# Patient Record
Sex: Female | Born: 1985 | Race: Black or African American | Hispanic: No | Marital: Single | Smoking: Never smoker
Health system: Southern US, Community
[De-identification: ages and names within clinical notes are randomized; demographics above are authoritative.]

## PROBLEM LIST (undated history)

## (undated) ENCOUNTER — Inpatient Hospital Stay (HOSPITAL_COMMUNITY): Payer: Self-pay

## (undated) DIAGNOSIS — IMO0002 Reserved for concepts with insufficient information to code with codable children: Secondary | ICD-10-CM

## (undated) DIAGNOSIS — R51 Headache: Secondary | ICD-10-CM

## (undated) DIAGNOSIS — K589 Irritable bowel syndrome without diarrhea: Secondary | ICD-10-CM

## (undated) HISTORY — PX: NO PAST SURGERIES: SHX2092

---
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
## 2005-11-01 ENCOUNTER — Other Ambulatory Visit: Admission: RE | Admit: 2005-11-01 | Discharge: 2005-11-01 | Payer: Self-pay | Admitting: Family Medicine

## 2006-07-03 ENCOUNTER — Other Ambulatory Visit: Admission: RE | Admit: 2006-07-03 | Discharge: 2006-07-03 | Payer: Self-pay | Admitting: Obstetrics and Gynecology

## 2007-01-05 ENCOUNTER — Other Ambulatory Visit: Admission: RE | Admit: 2007-01-05 | Discharge: 2007-01-05 | Payer: Self-pay | Admitting: Obstetrics and Gynecology

## 2007-06-26 ENCOUNTER — Other Ambulatory Visit: Admission: RE | Admit: 2007-06-26 | Discharge: 2007-06-26 | Payer: Self-pay | Admitting: Obstetrics and Gynecology

## 2008-04-16 ENCOUNTER — Emergency Department (HOSPITAL_COMMUNITY): Admission: EM | Admit: 2008-04-16 | Discharge: 2008-04-16 | Payer: Self-pay | Admitting: Family Medicine

## 2008-04-26 ENCOUNTER — Emergency Department (HOSPITAL_COMMUNITY): Admission: EM | Admit: 2008-04-26 | Discharge: 2008-04-26 | Payer: Self-pay | Admitting: Emergency Medicine

## 2008-05-21 ENCOUNTER — Ambulatory Visit: Payer: Self-pay | Admitting: Gastroenterology

## 2008-05-21 DIAGNOSIS — N390 Urinary tract infection, site not specified: Secondary | ICD-10-CM

## 2008-05-21 DIAGNOSIS — K589 Irritable bowel syndrome without diarrhea: Secondary | ICD-10-CM

## 2009-01-13 ENCOUNTER — Ambulatory Visit: Payer: Self-pay | Admitting: Obstetrics and Gynecology

## 2009-01-13 ENCOUNTER — Encounter: Payer: Self-pay | Admitting: Obstetrics & Gynecology

## 2009-01-13 LAB — CONVERTED CEMR LAB
Basophils Absolute: 0 10*3/uL (ref 0.0–0.1)
Basophils Relative: 0 % (ref 0–1)
HCT: 36.9 % (ref 36.0–46.0)
Hemoglobin: 12.4 g/dL (ref 12.0–15.0)
Hepatitis B Surface Ag: NEGATIVE
Hgb A2 Quant: 3 % (ref 2.2–3.2)
Lymphs Abs: 1.8 10*3/uL (ref 0.7–4.0)
MCV: 92.5 fL (ref 78.0–100.0)
Monocytes Relative: 7 % (ref 3–12)
Neutro Abs: 4.8 10*3/uL (ref 1.7–7.7)
Neutrophils Relative %: 67 % (ref 43–77)
Rh Type: POSITIVE
Rubella: 69.9 intl units/mL — ABNORMAL HIGH

## 2009-01-21 ENCOUNTER — Encounter: Payer: Self-pay | Admitting: Family

## 2009-01-21 ENCOUNTER — Ambulatory Visit: Payer: Self-pay | Admitting: Obstetrics & Gynecology

## 2009-01-21 ENCOUNTER — Encounter: Payer: Self-pay | Admitting: Obstetrics and Gynecology

## 2009-02-18 ENCOUNTER — Ambulatory Visit: Payer: Self-pay | Admitting: Obstetrics and Gynecology

## 2009-03-18 ENCOUNTER — Ambulatory Visit (HOSPITAL_COMMUNITY): Admission: RE | Admit: 2009-03-18 | Discharge: 2009-03-18 | Payer: Self-pay | Admitting: Family Medicine

## 2009-03-18 ENCOUNTER — Ambulatory Visit: Payer: Self-pay | Admitting: Obstetrics & Gynecology

## 2009-04-15 ENCOUNTER — Ambulatory Visit: Payer: Self-pay | Admitting: Obstetrics and Gynecology

## 2009-05-13 ENCOUNTER — Ambulatory Visit: Payer: Self-pay | Admitting: Obstetrics & Gynecology

## 2009-06-03 ENCOUNTER — Ambulatory Visit: Payer: Self-pay | Admitting: Obstetrics and Gynecology

## 2009-06-03 LAB — CONVERTED CEMR LAB
HCT: 34.3 % — ABNORMAL LOW (ref 36.0–46.0)
Platelets: 300 10*3/uL (ref 150–400)
RBC: 3.74 M/uL — ABNORMAL LOW (ref 3.87–5.11)
RDW: 13.4 % (ref 11.5–15.5)

## 2009-07-08 ENCOUNTER — Ambulatory Visit: Payer: Self-pay | Admitting: Obstetrics and Gynecology

## 2009-07-15 ENCOUNTER — Encounter: Payer: Self-pay | Admitting: Family

## 2009-07-15 ENCOUNTER — Ambulatory Visit: Payer: Self-pay | Admitting: Obstetrics and Gynecology

## 2009-07-22 ENCOUNTER — Encounter: Payer: Self-pay | Admitting: Physician Assistant

## 2009-07-22 ENCOUNTER — Ambulatory Visit: Payer: Self-pay | Admitting: Obstetrics and Gynecology

## 2009-07-23 ENCOUNTER — Encounter: Payer: Self-pay | Admitting: Obstetrics and Gynecology

## 2009-07-24 ENCOUNTER — Ambulatory Visit: Payer: Self-pay | Admitting: Obstetrics & Gynecology

## 2009-07-29 ENCOUNTER — Ambulatory Visit: Payer: Self-pay | Admitting: Obstetrics and Gynecology

## 2009-07-29 ENCOUNTER — Encounter: Payer: Self-pay | Admitting: Physician Assistant

## 2009-08-05 ENCOUNTER — Ambulatory Visit: Payer: Self-pay | Admitting: Obstetrics and Gynecology

## 2009-08-12 ENCOUNTER — Ambulatory Visit: Payer: Self-pay | Admitting: Obstetrics and Gynecology

## 2009-08-12 ENCOUNTER — Encounter: Payer: Self-pay | Admitting: Advanced Practice Midwife

## 2009-08-12 LAB — CONVERTED CEMR LAB: Chlamydia, Swab/Urine, PCR: NEGATIVE

## 2009-08-13 ENCOUNTER — Ambulatory Visit: Payer: Self-pay | Admitting: Family Medicine

## 2009-08-13 ENCOUNTER — Inpatient Hospital Stay (HOSPITAL_COMMUNITY): Admission: AD | Admit: 2009-08-13 | Discharge: 2009-08-16 | Payer: Self-pay | Admitting: Family Medicine

## 2010-03-30 ENCOUNTER — Emergency Department (HOSPITAL_COMMUNITY): Admission: EM | Admit: 2010-03-30 | Discharge: 2010-03-30 | Payer: Self-pay | Admitting: Family Medicine

## 2010-06-20 NOTE — L&D Delivery Note (Signed)
Delivery Note At  a viable unspecified sex was delivered via  (Presentation: ;  ).  APGAR: , ; weight .   Placenta status: , .  Cord:  with the following complications: .  Cord pH: not done  Anesthesia:   Episiotomy:  Lacerations:  Suture Repair: 2.0 Est. Blood Loss (mL):   Mom to postpartum.  Baby to nursery-stable.  MARSHALL,BERNARD A 04/21/2011, 11:24 PM

## 2010-09-02 LAB — POCT URINALYSIS DIPSTICK
Protein, ur: NEGATIVE mg/dL
Specific Gravity, Urine: >=1.03 (ref 1.005–1.030)
Urobilinogen, UA: 0.2 mg/dL (ref 0.0–1.0)

## 2010-09-02 LAB — POCT PREGNANCY, URINE: Preg Test, Ur: NEGATIVE

## 2010-09-05 LAB — POCT URINALYSIS DIP (DEVICE)
Hgb urine dipstick: NEGATIVE
Nitrite: NEGATIVE
Protein, ur: NEGATIVE mg/dL
Specific Gravity, Urine: 1.02 (ref 1.005–1.030)
Urobilinogen, UA: 2 mg/dL — ABNORMAL HIGH (ref 0.0–1.0)
Urobilinogen, UA: 4 mg/dL — ABNORMAL HIGH (ref 0.0–1.0)
pH: 7.5 (ref 5.0–8.0)

## 2010-09-08 LAB — POCT URINALYSIS DIP (DEVICE)
Hgb urine dipstick: NEGATIVE
Ketones, ur: 40 mg/dL — AB
Ketones, ur: NEGATIVE mg/dL
Nitrite: POSITIVE — AB
Protein, ur: 30 mg/dL — AB
Protein, ur: 30 mg/dL — AB
Protein, ur: 30 mg/dL — AB
Protein, ur: NEGATIVE mg/dL
Specific Gravity, Urine: 1.025 (ref 1.005–1.030)
Specific Gravity, Urine: 1.025 (ref 1.005–1.030)
Urobilinogen, UA: 2 mg/dL — ABNORMAL HIGH (ref 0.0–1.0)
Urobilinogen, UA: 2 mg/dL — ABNORMAL HIGH (ref 0.0–1.0)
pH: 6 (ref 5.0–8.0)
pH: 6 (ref 5.0–8.0)
pH: 6.5 (ref 5.0–8.0)

## 2010-09-08 LAB — CBC
HCT: 35.2 % — ABNORMAL LOW (ref 36.0–46.0)
MCHC: 33.3 g/dL (ref 30.0–36.0)
MCV: 95.1 fL (ref 78.0–100.0)
Platelets: 338 10*3/uL (ref 150–400)
RDW: 13.7 % (ref 11.5–15.5)

## 2010-09-10 ENCOUNTER — Inpatient Hospital Stay (HOSPITAL_COMMUNITY): Payer: Medicaid Other

## 2010-09-10 ENCOUNTER — Ambulatory Visit (INDEPENDENT_AMBULATORY_CARE_PROVIDER_SITE_OTHER): Payer: Self-pay | Admitting: Obstetrics & Gynecology

## 2010-09-10 ENCOUNTER — Inpatient Hospital Stay (HOSPITAL_COMMUNITY)
Admission: AD | Admit: 2010-09-10 | Discharge: 2010-09-10 | Disposition: A | Discharge status: Home / Self Care | Payer: Medicaid Other | Source: Ambulatory Visit | Attending: Obstetrics & Gynecology | Admitting: Obstetrics & Gynecology

## 2010-09-10 ENCOUNTER — Encounter (HOSPITAL_COMMUNITY): Payer: Self-pay | Admitting: Radiology

## 2010-09-10 DIAGNOSIS — R1032 Left lower quadrant pain: Secondary | ICD-10-CM

## 2010-09-10 DIAGNOSIS — R1084 Generalized abdominal pain: Secondary | ICD-10-CM

## 2010-09-10 DIAGNOSIS — O99891 Other specified diseases and conditions complicating pregnancy: Secondary | ICD-10-CM | POA: Insufficient documentation

## 2010-09-10 DIAGNOSIS — O9989 Other specified diseases and conditions complicating pregnancy, childbirth and the puerperium: Secondary | ICD-10-CM

## 2010-09-10 DIAGNOSIS — Z3201 Encounter for pregnancy test, result positive: Secondary | ICD-10-CM

## 2010-09-10 LAB — URINALYSIS, ROUTINE W REFLEX MICROSCOPIC
Bilirubin Urine: NEGATIVE
Glucose, UA: NEGATIVE mg/dL
Nitrite: NEGATIVE
Specific Gravity, Urine: >1.03 — ABNORMAL HIGH (ref 1.005–1.030)
pH: 5.5 (ref 5.0–8.0)

## 2010-09-10 LAB — CBC
MCH: 30.5 pg (ref 26.0–34.0)
MCHC: 32.8 g/dL (ref 30.0–36.0)
Platelets: 370 10*3/uL (ref 150–400)
RDW: 12.9 % (ref 11.5–15.5)

## 2010-09-10 LAB — URINE MICROSCOPIC-ADD ON

## 2010-09-10 LAB — HCG, QUANTITATIVE, PREGNANCY: hCG, Beta Chain, Quant, S: 254 m[IU]/mL — ABNORMAL HIGH (ref <5)

## 2010-09-10 LAB — POCT PREGNANCY, URINE: Preg Test, Ur: POSITIVE

## 2010-09-11 NOTE — Progress Notes (Signed)
NAME:  Judith Mcintosh, Judith Mcintosh NO.:  [id number]  MEDICAL RECORD NO.:  [id number]           PATIENT TYPE:  A  LOCATION:  [facility]                   FACILITY:  [facility]  PHYSICIAN:  Allie Bossier, MD        DATE OF BIRTH:  Apr 22, 1986  DATE OF SERVICE:  09/10/2010                                 CLINIC NOTE  [name] is a 25 year old single black, gravida 3, para 2 with an unknown LMP (she thinks it was the beginning of February).  She started experiencing left lower quadrant pain today and called the GYN Clinic and they were able to work her in today.  Upon arrival, it turns out that she has a positive pregnancy test.  A quick abdominal exam shows that she does not have any peritoneal signs.  Due to the fact that the clinic will be closed in an hour, I have recommended that we send her immediately to the MAU where she can have the appropriate workup for rule out ectopic pregnancy.  She is amenable to this.  I called the MAU and they are not busy at this time.  Please note her vital signs are stable at this moment.     Allie Bossier, MD    MCD/MEDQ  D:  09/10/2010  T:  09/11/2010  Job:  [id number]

## 2010-09-13 ENCOUNTER — Other Ambulatory Visit: Payer: Self-pay | Admitting: Obstetrics & Gynecology

## 2010-09-13 ENCOUNTER — Inpatient Hospital Stay (HOSPITAL_COMMUNITY)
Admission: AD | Admit: 2010-09-13 | Discharge: 2010-09-13 | Disposition: A | Discharge status: Home / Self Care | Payer: Medicaid Other | Source: Ambulatory Visit | Attending: Obstetrics & Gynecology | Admitting: Obstetrics & Gynecology

## 2010-09-13 DIAGNOSIS — O99891 Other specified diseases and conditions complicating pregnancy: Secondary | ICD-10-CM | POA: Insufficient documentation

## 2010-09-13 DIAGNOSIS — O3680X0 Pregnancy with inconclusive fetal viability, not applicable or unspecified: Secondary | ICD-10-CM

## 2010-09-13 DIAGNOSIS — O9989 Other specified diseases and conditions complicating pregnancy, childbirth and the puerperium: Secondary | ICD-10-CM

## 2010-09-20 ENCOUNTER — Inpatient Hospital Stay (HOSPITAL_COMMUNITY)
Admission: AD | Admit: 2010-09-20 | Discharge: 2010-09-20 | Disposition: A | Discharge status: Home / Self Care | Payer: Medicaid Other | Source: Ambulatory Visit | Attending: Obstetrics & Gynecology | Admitting: Obstetrics & Gynecology

## 2010-09-20 ENCOUNTER — Ambulatory Visit (HOSPITAL_COMMUNITY)
Admission: RE | Admit: 2010-09-20 | Discharge: 2010-09-20 | Disposition: A | Discharge status: Home / Self Care | Payer: Medicaid Other | Source: Ambulatory Visit | Attending: Obstetrics & Gynecology | Admitting: Obstetrics & Gynecology

## 2010-09-20 DIAGNOSIS — O3680X0 Pregnancy with inconclusive fetal viability, not applicable or unspecified: Secondary | ICD-10-CM

## 2010-09-20 DIAGNOSIS — O368390 Maternal care for abnormalities of the fetal heart rate or rhythm, unspecified trimester, not applicable or unspecified: Secondary | ICD-10-CM | POA: Insufficient documentation

## 2010-09-20 DIAGNOSIS — O99891 Other specified diseases and conditions complicating pregnancy: Secondary | ICD-10-CM | POA: Insufficient documentation

## 2010-09-20 DIAGNOSIS — Z3689 Encounter for other specified antenatal screening: Secondary | ICD-10-CM | POA: Insufficient documentation

## 2010-09-21 LAB — POCT URINALYSIS DIP (DEVICE)
Ketones, ur: NEGATIVE mg/dL
Protein, ur: NEGATIVE mg/dL
Specific Gravity, Urine: 1.025 (ref 1.005–1.030)
pH: 5.5 (ref 5.0–8.0)

## 2010-09-22 LAB — POCT URINALYSIS DIP (DEVICE)
Protein, ur: NEGATIVE mg/dL
Specific Gravity, Urine: 1.02 (ref 1.005–1.030)
Urobilinogen, UA: 1 mg/dL (ref 0.0–1.0)

## 2010-09-23 LAB — POCT URINALYSIS DIP (DEVICE)
Hgb urine dipstick: NEGATIVE
Nitrite: NEGATIVE
Protein, ur: NEGATIVE mg/dL
Urobilinogen, UA: 1 mg/dL (ref 0.0–1.0)
pH: 6 (ref 5.0–8.0)

## 2010-09-24 LAB — POCT URINALYSIS DIP (DEVICE)
Nitrite: NEGATIVE
Protein, ur: NEGATIVE mg/dL
Protein, ur: 30 mg/dL — AB
Specific Gravity, Urine: 1.025 (ref 1.005–1.030)
Urobilinogen, UA: 1 mg/dL (ref 0.0–1.0)
Urobilinogen, UA: 1 mg/dL (ref 0.0–1.0)
pH: 7.5 (ref 5.0–8.0)

## 2010-09-25 LAB — POCT URINALYSIS DIP (DEVICE)
Glucose, UA: NEGATIVE mg/dL
Hgb urine dipstick: NEGATIVE
Nitrite: NEGATIVE
Urobilinogen, UA: 1 mg/dL (ref 0.0–1.0)

## 2010-11-05 NOTE — Op Note (Signed)
NAME:  Judith Mcintosh, Judith Mcintosh                       ACCOUNT NO.:  [id number]   MEDICAL RECORD NO.:  [id number]                   PATIENT TYPE:  INP   LOCATION:  9163                                 [facility]   PHYSICIAN:  Charles A. Delcambre, MD            DATE OF BIRTH:  [birth year]   DATE OF PROCEDURE:  07/29/2003  DATE OF DISCHARGE:                                 OPERATIVE REPORT   This patient was admitted after coming in in spontaneous labor.  She is a 25-  year-old black female, para 0-0-0-0, [facility] July 24, 2003, by 17-week  ultrasound.  Estimated gestational age [redacted] weeks 5 days.  She presented  complaining of contractions onset about 10 p.m. last night and every three  minutes and moderate.  She denied rupture of membranes.  She did have some  bleeding that was spotting with contractions, and she noted active fetal  movement.  Upon arrival she was 8-9 cm, at 0200-0300.  She is Rh positive,  rubella immune, hemoglobin 12.6, platelets 322. One-hour Glucola 98.  Quad  screen normal.  Positive group B strep.  She received penicillin prophylaxis  during this labor course.   PHYSICAL EXAMINATION:  GENERAL:  Alert and oriented x3.  VITAL SIGNS:  Blood pressure 100s/70s, respirations 16, pulse 80s, afebrile.  HEENT:  There is question of sties at the eyes bilaterally.  She has seen  her PCP in this regard.  These are stable.  NECK:  Supple.  CARDIAC:  Regular rate and rhythm.  CHEST:  Lungs clear.  ABDOMEN:  Gravid, fundal height 40 cm, estimated fetal weight 3600 g.  Fetal  heart tones 130s, reactive, without decelerations.  Contractions were every  three to four minutes.  EXTREMITIES:  Nontender.  PELVIC:  Cervix was 8, complete, 0 station.  A bulging bag of water was  noted.   With this arrest at 8 cm by my examination at 0640, inadequate labor was  felt to be present with intact membranes.  Artificial rupture of membranes  was therefore done to augment, no complications  were noted.  Moderate  meconium was noted.  Pitocin was begun at low dose and increased to 4 milli-  international units per minute to augment.  At 0825 she was 8, complete, and  0 station, no change.  Fetal heart rate 130-140s, reactive without  decelerations.  Contractions were every three minutes, Pitocin was at 4.  She was observed a further hour and was noted to be completely dilated,  complete, and 0 to +1 station.  Fetal heart rate was 130s, reactive without  decelerations.  Contractions were every three minutes.  Pitocin was at 4  milli-international units per minute.  She began pushing at that time.  She  proceeded on for timing purposes, complete dilation at 0928, and delivery at  1048.  Placenta followed at 1053.  Indicated vacuum extraction was done  secondary to fetal heart rate  decelerations either late decelerations or  moderate variables from the 150s-160s down to 120s after contractions.  These did not improve with position change and for this reason, vacuum  extraction was undertaken with lack of progress beyond +2 station with the  patient's pushing efforts.  Manual rotation was achieved from right occiput  posterior to right occiput anterior.  The Kiwi vacuum was then applied, the  Foley was removed.  In the green zone the Kiwi vacuum was used and there was  good progress over three pushes to achieve change from +2 to +3 station.  At  that time one pop-off occurred.  She pushed further for two pushes without  the vacuum and had little change.  For that reason vacuum was reapplied at  +3 station and in one contraction, again in the green zone, with maternal  pushing efforts the baby delivered.  Apgars were 4 and 9 at one and five  minutes, respectively.  Cord gases returned arterial 7.14, venous 7.25.  The  baby was vigorous after stimulation.  Total vacuum time was 11 minutes.  Total contractions the vacuum was used was four, one pop-off occurred.  A  second degree  midline episiotomy was done during the vacuum extraction.  A  right lateral vaginal laceration was noted and repaired with 3-0 Vicryl.  Chromic 4-0 was used to repair a right labial laceration.  The placenta was  three-vessel, intact, and spontaneous.  Estimated blood loss 400 mL.  Once  delivery of head and shoulders, meconium was suctioned with the DeLee  suction.  The baby cried spontaneously.   COMPLICATIONS:  1. Variable decelerations, some late decelerations.  2. Meconium.   PROCEDURES:  1. Indicated vacuum extraction.  2. Repair of episiotomy and lacerations.                                               Charles A. Sydnee Cabal, MD    CAD/MEDQ  D:  07/29/2003  T:  07/29/2003  Job:  [id number]

## 2010-12-01 ENCOUNTER — Emergency Department (HOSPITAL_COMMUNITY)
Admission: EM | Admit: 2010-12-01 | Discharge: 2010-12-01 | Disposition: A | Discharge status: Home / Self Care | Payer: Medicaid Other | Attending: Emergency Medicine | Admitting: Emergency Medicine

## 2010-12-01 DIAGNOSIS — R509 Fever, unspecified: Secondary | ICD-10-CM | POA: Insufficient documentation

## 2010-12-01 DIAGNOSIS — Z8541 Personal history of malignant neoplasm of cervix uteri: Secondary | ICD-10-CM | POA: Insufficient documentation

## 2010-12-01 DIAGNOSIS — O9989 Other specified diseases and conditions complicating pregnancy, childbirth and the puerperium: Secondary | ICD-10-CM | POA: Insufficient documentation

## 2010-12-01 DIAGNOSIS — J069 Acute upper respiratory infection, unspecified: Secondary | ICD-10-CM | POA: Insufficient documentation

## 2010-12-01 DIAGNOSIS — R51 Headache: Secondary | ICD-10-CM | POA: Insufficient documentation

## 2010-12-01 DIAGNOSIS — IMO0001 Reserved for inherently not codable concepts without codable children: Secondary | ICD-10-CM | POA: Insufficient documentation

## 2010-12-01 DIAGNOSIS — R000 Tachycardia, unspecified: Secondary | ICD-10-CM | POA: Insufficient documentation

## 2010-12-01 LAB — URINALYSIS, ROUTINE W REFLEX MICROSCOPIC
Bilirubin Urine: NEGATIVE
Bilirubin Urine: NEGATIVE
Glucose, UA: NEGATIVE mg/dL
Ketones, ur: 40 mg/dL — AB
Nitrite: NEGATIVE
Protein, ur: NEGATIVE mg/dL
Specific Gravity, Urine: 1 — ABNORMAL LOW (ref 1.005–1.030)
Urobilinogen, UA: 1 mg/dL (ref 0.0–1.0)
pH: 7.5 (ref 5.0–8.0)

## 2010-12-01 LAB — CBC
Hemoglobin: 10.9 g/dL — ABNORMAL LOW (ref 12.0–15.0)
MCHC: 34.9 g/dL (ref 30.0–36.0)
Platelets: 277 10*3/uL (ref 150–400)
RDW: 13.5 % (ref 11.5–15.5)

## 2010-12-01 LAB — COMPREHENSIVE METABOLIC PANEL
ALT: 7 U/L (ref 0–35)
AST: 11 U/L (ref 0–37)
Albumin: 3.3 g/dL — ABNORMAL LOW (ref 3.5–5.2)
Alkaline Phosphatase: 58 U/L (ref 39–117)
Glucose, Bld: 80 mg/dL (ref 70–99)
Potassium: 3.6 mEq/L (ref 3.5–5.1)
Sodium: 135 mEq/L (ref 135–145)
Total Protein: 7 g/dL (ref 6.0–8.3)

## 2010-12-01 LAB — URINE MICROSCOPIC-ADD ON

## 2010-12-01 LAB — DIFFERENTIAL
Basophils Absolute: 0 10*3/uL (ref 0.0–0.1)
Basophils Relative: 0 % (ref 0–1)
Monocytes Absolute: 0.7 10*3/uL (ref 0.1–1.0)
Neutro Abs: 7.5 10*3/uL (ref 1.7–7.7)

## 2010-12-02 ENCOUNTER — Inpatient Hospital Stay (HOSPITAL_COMMUNITY)
Admission: AD | Admit: 2010-12-02 | Discharge: 2010-12-02 | Disposition: A | Discharge status: Home / Self Care | Payer: Medicaid Other | Source: Ambulatory Visit | Attending: Obstetrics | Admitting: Obstetrics

## 2010-12-02 DIAGNOSIS — R51 Headache: Secondary | ICD-10-CM | POA: Insufficient documentation

## 2010-12-02 DIAGNOSIS — E860 Dehydration: Secondary | ICD-10-CM | POA: Insufficient documentation

## 2010-12-02 DIAGNOSIS — O99891 Other specified diseases and conditions complicating pregnancy: Secondary | ICD-10-CM | POA: Insufficient documentation

## 2010-12-02 LAB — URINE CULTURE: Colony Count: 60000

## 2010-12-02 LAB — URINALYSIS, ROUTINE W REFLEX MICROSCOPIC
Hgb urine dipstick: NEGATIVE
Ketones, ur: >80 mg/dL — AB
Protein, ur: NEGATIVE mg/dL
Urobilinogen, UA: 2 mg/dL — ABNORMAL HIGH (ref 0.0–1.0)

## 2011-03-22 LAB — POCT URINALYSIS DIP (DEVICE)
Glucose, UA: NEGATIVE
Nitrite: NEGATIVE
Operator id: 247071
Protein, ur: NEGATIVE
Specific Gravity, Urine: 1.025
Urobilinogen, UA: 0.2

## 2011-03-22 LAB — URINE MICROSCOPIC-ADD ON

## 2011-03-22 LAB — URINALYSIS, ROUTINE W REFLEX MICROSCOPIC
Glucose, UA: NEGATIVE
Hgb urine dipstick: NEGATIVE
Specific Gravity, Urine: 1.034 — ABNORMAL HIGH
Urobilinogen, UA: 1
pH: 6.5

## 2011-03-22 LAB — COMPREHENSIVE METABOLIC PANEL
ALT: 14
AST: 19
Alkaline Phosphatase: 50
CO2: 26
Chloride: 104
GFR calc Af Amer: >60
GFR calc non Af Amer: >60
Glucose, Bld: 112 — ABNORMAL HIGH
Potassium: 3.7
Sodium: 136
Total Bilirubin: 0.8

## 2011-03-22 LAB — WET PREP, GENITAL

## 2011-03-22 LAB — GC/CHLAMYDIA PROBE AMP, GENITAL
Chlamydia, DNA Probe: NEGATIVE
GC Probe Amp, Genital: NEGATIVE

## 2011-03-22 LAB — DIFFERENTIAL
Basophils Relative: 1
Eosinophils Absolute: 0
Eosinophils Relative: 1
Lymphs Abs: 2.4

## 2011-03-22 LAB — CBC
Hemoglobin: 13.1
RBC: 4.01
WBC: 8.8

## 2011-03-22 LAB — LIPASE, BLOOD: Lipase: 591 — ABNORMAL HIGH

## 2011-03-22 LAB — POCT PREGNANCY, URINE: Preg Test, Ur: NEGATIVE

## 2011-04-14 LAB — STREP B DNA PROBE: GBS: NEGATIVE

## 2011-04-21 ENCOUNTER — Inpatient Hospital Stay (HOSPITAL_COMMUNITY)
Admission: AD | Admit: 2011-04-21 | Discharge: 2011-04-23 | DRG: 775 | Disposition: A | Discharge status: Home / Self Care | Payer: Medicaid Other | Source: Ambulatory Visit | Attending: Obstetrics & Gynecology | Admitting: Obstetrics & Gynecology

## 2011-04-21 ENCOUNTER — Encounter (HOSPITAL_COMMUNITY): Payer: Self-pay

## 2011-04-21 ENCOUNTER — Encounter (HOSPITAL_COMMUNITY): Payer: Self-pay | Admitting: Anesthesiology

## 2011-04-21 ENCOUNTER — Inpatient Hospital Stay (HOSPITAL_COMMUNITY): Payer: Medicaid Other | Admitting: Anesthesiology

## 2011-04-21 DIAGNOSIS — O42919 Preterm premature rupture of membranes, unspecified as to length of time between rupture and onset of labor, unspecified trimester: Secondary | ICD-10-CM

## 2011-04-21 DIAGNOSIS — O4290 Premature rupture of membranes, unspecified as to length of time between rupture and onset of labor, unspecified weeks of gestation: Principal | ICD-10-CM | POA: Diagnosis present

## 2011-04-21 DIAGNOSIS — D649 Anemia, unspecified: Secondary | ICD-10-CM | POA: Diagnosis not present

## 2011-04-21 DIAGNOSIS — O9903 Anemia complicating the puerperium: Secondary | ICD-10-CM | POA: Diagnosis not present

## 2011-04-21 HISTORY — DX: Headache: R51

## 2011-04-21 HISTORY — DX: Reserved for concepts with insufficient information to code with codable children: IMO0002

## 2011-04-21 LAB — CBC
Hemoglobin: 10 g/dL — ABNORMAL LOW (ref 12.0–15.0)
MCH: 28.7 pg (ref 26.0–34.0)
MCHC: 32.4 g/dL (ref 30.0–36.0)
RDW: 14 % (ref 11.5–15.5)

## 2011-04-21 LAB — RPR: RPR Ser Ql: NONREACTIVE

## 2011-04-21 MED ORDER — CITRIC ACID-SODIUM CITRATE 334-500 MG/5ML PO SOLN: 30.0000 mL | ORAL | Status: DC | PRN

## 2011-04-21 MED ORDER — OXYTOCIN 20 UNITS IN LACTATED RINGERS INFUSION - SIMPLE
1.0000 m[IU]/min | INTRAVENOUS | Status: DC
  Administered 2011-04-21: 10 m[IU]/min via INTRAVENOUS
  Administered 2011-04-21: 2 m[IU]/min via INTRAVENOUS
  Administered 2011-04-21: 333 m[IU]/min via INTRAVENOUS
  Administered 2011-04-21: 12 m[IU]/min via INTRAVENOUS
  Filled 2011-04-21: qty 1000

## 2011-04-21 MED ORDER — EPHEDRINE 5 MG/ML INJ
10.0000 mg | INTRAVENOUS | Status: DC | PRN
  Filled 2011-04-21 (×2): qty 4

## 2011-04-21 MED ORDER — LACTATED RINGERS IV SOLN
500.0000 mL | INTRAVENOUS | Status: DC | PRN
  Administered 2011-04-21: 500 mL via INTRAVENOUS

## 2011-04-21 MED ORDER — ACETAMINOPHEN 325 MG PO TABS: 650.0000 mg | ORAL_TABLET | ORAL | Status: DC | PRN

## 2011-04-21 MED ORDER — EPHEDRINE 5 MG/ML INJ
10.0000 mg | INTRAVENOUS | Status: DC | PRN
  Filled 2011-04-21: qty 4

## 2011-04-21 MED ORDER — ONDANSETRON HCL 4 MG/2ML IJ SOLN: 4.0000 mg | Freq: Four times a day (QID) | INTRAMUSCULAR | Status: DC | PRN

## 2011-04-21 MED ORDER — DIPHENHYDRAMINE HCL 50 MG/ML IJ SOLN: 12.5000 mg | INTRAMUSCULAR | Status: DC | PRN

## 2011-04-21 MED ORDER — OXYTOCIN BOLUS FROM INFUSION
500.0000 mL | Freq: Once | INTRAVENOUS | Status: DC
  Filled 2011-04-21: qty 500

## 2011-04-21 MED ORDER — LIDOCAINE HCL 1.5 % IJ SOLN
INTRAMUSCULAR | Status: DC | PRN
  Administered 2011-04-21: 4 mL via EPIDURAL
  Administered 2011-04-21: 3 mL via EPIDURAL

## 2011-04-21 MED ORDER — TERBUTALINE SULFATE 1 MG/ML IJ SOLN: 0.2500 mg | Freq: Once | INTRAMUSCULAR | Status: AC | PRN

## 2011-04-21 MED ORDER — BUTORPHANOL TARTRATE 2 MG/ML IJ SOLN: 1.0000 mg | INTRAMUSCULAR | Status: DC | PRN

## 2011-04-21 MED ORDER — OXYTOCIN 20 UNITS IN LACTATED RINGERS INFUSION - SIMPLE: 125.0000 mL/h | Freq: Once | INTRAVENOUS | Status: DC

## 2011-04-21 MED ORDER — FENTANYL 2.5 MCG/ML BUPIVACAINE 1/10 % EPIDURAL INFUSION (WH - ANES)
14.0000 mL/h | INTRAMUSCULAR | Status: DC
  Filled 2011-04-21 (×2): qty 60

## 2011-04-21 MED ORDER — PHENYLEPHRINE 40 MCG/ML (10ML) SYRINGE FOR IV PUSH (FOR BLOOD PRESSURE SUPPORT)
80.0000 ug | PREFILLED_SYRINGE | INTRAVENOUS | Status: DC | PRN
  Filled 2011-04-21 (×2): qty 5

## 2011-04-21 MED ORDER — IBUPROFEN 600 MG PO TABS
600.0000 mg | ORAL_TABLET | Freq: Four times a day (QID) | ORAL | Status: DC | PRN
  Administered 2011-04-22: 600 mg via ORAL
  Filled 2011-04-21: qty 1

## 2011-04-21 MED ORDER — LIDOCAINE HCL (PF) 1 % IJ SOLN
30.0000 mL | INTRAMUSCULAR | Status: DC | PRN
  Filled 2011-04-21 (×2): qty 30

## 2011-04-21 MED ORDER — HYDROXYZINE HCL 50 MG PO TABS
50.0000 mg | ORAL_TABLET | Freq: Four times a day (QID) | ORAL | Status: DC | PRN
  Filled 2011-04-21: qty 1

## 2011-04-21 MED ORDER — LACTATED RINGERS IV SOLN: 500.0000 mL | Freq: Once | INTRAVENOUS | Status: DC

## 2011-04-21 MED ORDER — PHENYLEPHRINE 40 MCG/ML (10ML) SYRINGE FOR IV PUSH (FOR BLOOD PRESSURE SUPPORT)
80.0000 ug | PREFILLED_SYRINGE | INTRAVENOUS | Status: DC | PRN
  Filled 2011-04-21: qty 5

## 2011-04-21 MED ORDER — FENTANYL 2.5 MCG/ML BUPIVACAINE 1/10 % EPIDURAL INFUSION (WH - ANES)
INTRAMUSCULAR | Status: DC | PRN
  Administered 2011-04-21: 11 mL/h via EPIDURAL
  Administered 2011-04-21: 20:00:00

## 2011-04-21 MED ORDER — ZOLPIDEM TARTRATE 10 MG PO TABS: 10.0000 mg | ORAL_TABLET | Freq: Every evening | ORAL | Status: DC | PRN

## 2011-04-21 MED ORDER — OXYCODONE-ACETAMINOPHEN 5-325 MG PO TABS
2.0000 | ORAL_TABLET | ORAL | Status: DC | PRN
  Filled 2011-04-21 (×4): qty 1

## 2011-04-21 MED ORDER — LACTATED RINGERS IV SOLN
INTRAVENOUS | Status: DC
  Administered 2011-04-21 (×3): via INTRAVENOUS

## 2011-04-21 MED ORDER — HYDROXYZINE HCL 50 MG/ML IM SOLN
50.0000 mg | Freq: Four times a day (QID) | INTRAMUSCULAR | Status: DC | PRN
  Filled 2011-04-21: qty 1

## 2011-04-21 NOTE — H&P (Signed)
Judith Mcintosh is a 25 y.o. female presenting for SROM. Maternal Medical History:  Reason for admission: Reason for admission: rupture of membranes.  Reason for Admission:   nauseaFetal activity: Perceived fetal activity is normal.    Prenatal complications: no prenatal complications   OB History    Grav Para Term Preterm Abortions TAB SAB Ect Mult Living   3 2 2  0  0 0 0 0 2     Past Medical History  Diagnosis Date  . Abnormal Pap smear 2005    LEEP   Past Surgical History  Procedure Date  . Leep 2005   Family History: family history is not on file. Social History:  reports that she has never smoked. She does not have any smokeless tobacco history on file. She reports that she does not drink alcohol or use illicit drugs.  Review of Systems  Constitutional: Negative for fever.  Eyes: Negative for blurred vision.  Respiratory: Negative for shortness of breath.   Gastrointestinal: Negative for nausea and vomiting.  Skin: Negative for rash.  Neurological: Negative for headaches.    Exam by:: Henrietta Hoover, PA Blood pressure 113/47, pulse 101, temperature 99.5 F (37.5 C), temperature source Oral, resp. rate 20, height 4\' 8"  (1.422 m), weight 68.04 kg (150 lb), SpO2 99.00%. Maternal Exam:  Abdomen: Patient reports no abdominal tenderness. Fetal presentation: vertex  Introitus: not evaluated.     Fetal Exam Fetal Monitor Review: Variability: moderate (6-25 bpm).   Pattern: accelerations present and no decelerations.    Fetal State Assessment: Category I - tracings are normal.     Physical Exam  Constitutional: She appears well-developed.  HENT:  Head: Normocephalic.  Neck: Neck supple. No thyromegaly present.  Cardiovascular: Normal rate and regular rhythm.   Respiratory: Breath sounds normal.  GI: Soft. Bowel sounds are normal.  Skin: No rash noted.    Prenatal labs: ABO, Rh:   Antibody:   Rubella:   RPR: Nonreactive (05/29 0000)  HBsAg:    HIV:  Non-reactive (05/29 0000)  GBS:     Assessment/Plan: Multipara with PPROM @ [redacted]w[redacted]d, not in labor.  GBS negative on 10/25.  Admit Low dose Pitocin per protocol   JACKSON-MOORE,Jori Frerichs A 04/21/2011, 1:12 PM

## 2011-04-21 NOTE — Progress Notes (Signed)
Patient states she started leaking clear fluid at 1130. No contractions.

## 2011-04-21 NOTE — Anesthesia Preprocedure Evaluation (Signed)
Anesthesia Evaluation  Patient identified by MRN, date of birth, ID band Patient awake    Reviewed: Allergy & Precautions, H&P , Patient's Chart, lab work & pertinent test results  Airway Mallampati: III TM Distance: >3 FB Neck ROM: full    Dental No notable dental hx. (+) Teeth Intact   Pulmonary neg pulmonary ROS,  clear to auscultation  Pulmonary exam normal       Cardiovascular neg cardio ROS regular Normal    Neuro/Psych  Headaches, Negative Neurological ROS  Negative Psych ROS   GI/Hepatic negative GI ROS, Neg liver ROS,   Endo/Other  Negative Endocrine ROS  Renal/GU negative Renal ROS  Genitourinary negative   Musculoskeletal   Abdominal   Peds  Hematology negative hematology ROS (+)   Anesthesia Other Findings   Reproductive/Obstetrics (+) Pregnancy                           Anesthesia Physical Anesthesia Plan  ASA: II  Anesthesia Plan: Epidural   Post-op Pain Management:    Induction:   Airway Management Planned:   Additional Equipment:   Intra-op Plan:   Post-operative Plan:   Informed Consent: I have reviewed the patients History and Physical, chart, labs and discussed the procedure including the risks, benefits and alternatives for the proposed anesthesia with the patient or authorized representative who has indicated his/her understanding and acceptance.     Plan Discussed with: Anesthesiologist  Anesthesia Plan Comments:         Anesthesia Quick Evaluation

## 2011-04-21 NOTE — Anesthesia Procedure Notes (Signed)
Epidural Patient location during procedure: OB Start time: 04/21/2011 5:27 PM  Staffing Anesthesiologist: Synia Douglass A. Performed by: anesthesiologist   Preanesthetic Checklist Completed: patient identified, site marked, surgical consent, pre-op evaluation, timeout performed, IV checked, risks and benefits discussed and monitors and equipment checked  Epidural Patient position: sitting Prep: site prepped and draped and DuraPrep Patient monitoring: continuous pulse ox and blood pressure Approach: midline Injection technique: LOR air  Needle:  Needle type: Tuohy  Needle gauge: 17 G Needle length: 9 cm Needle insertion depth: 4 cm Catheter type: closed end flexible Catheter size: 19 Gauge Catheter at skin depth: 9 cm Test dose: negative and 1.5% lidocaine  Assessment Events: blood not aspirated, injection not painful, no injection resistance, negative IV test and no paresthesia  Additional Notes Patient is more comfortable after epidural dosed. Please see RN's note for documentation of vital signs and FHR which are stable.

## 2011-04-22 ENCOUNTER — Encounter (HOSPITAL_COMMUNITY): Payer: Self-pay | Admitting: *Deleted

## 2011-04-22 LAB — CBC
HCT: 27.5 % — ABNORMAL LOW (ref 36.0–46.0)
Hemoglobin: 9.1 g/dL — ABNORMAL LOW (ref 12.0–15.0)
MCH: 29.4 pg (ref 26.0–34.0)
MCHC: 33.1 g/dL (ref 30.0–36.0)
MCV: 89 fL (ref 78.0–100.0)
Platelets: 261 10*3/uL (ref 150–400)
RBC: 3.09 MIL/uL — ABNORMAL LOW (ref 3.87–5.11)
RDW: 14 % (ref 11.5–15.5)
WBC: 12.8 10*3/uL — ABNORMAL HIGH (ref 4.0–10.5)

## 2011-04-22 MED ORDER — ZOLPIDEM TARTRATE 5 MG PO TABS: 5.0000 mg | ORAL_TABLET | Freq: Every evening | ORAL | Status: DC | PRN

## 2011-04-22 MED ORDER — IBUPROFEN 600 MG PO TABS
600.0000 mg | ORAL_TABLET | Freq: Four times a day (QID) | ORAL | Status: DC
  Administered 2011-04-22 – 2011-04-23 (×6): 600 mg via ORAL
  Filled 2011-04-22 (×6): qty 1

## 2011-04-22 MED ORDER — LANOLIN HYDROUS EX OINT: TOPICAL_OINTMENT | CUTANEOUS | Status: DC | PRN

## 2011-04-22 MED ORDER — FERROUS SULFATE 325 (65 FE) MG PO TABS
325.0000 mg | ORAL_TABLET | Freq: Two times a day (BID) | ORAL | Status: DC
  Administered 2011-04-22 – 2011-04-23 (×3): 325 mg via ORAL
  Filled 2011-04-22 (×3): qty 1

## 2011-04-22 MED ORDER — DIBUCAINE 1 % RE OINT: 1.0000 "application " | TOPICAL_OINTMENT | RECTAL | Status: DC | PRN

## 2011-04-22 MED ORDER — ONDANSETRON HCL 4 MG PO TABS: 4.0000 mg | ORAL_TABLET | ORAL | Status: DC | PRN

## 2011-04-22 MED ORDER — PRENATAL PLUS 27-1 MG PO TABS
1.0000 | ORAL_TABLET | Freq: Every day | ORAL | Status: DC
  Administered 2011-04-22 – 2011-04-23 (×2): 1 via ORAL
  Filled 2011-04-22 (×2): qty 1

## 2011-04-22 MED ORDER — TETANUS-DIPHTH-ACELL PERTUSSIS 5-2.5-18.5 LF-MCG/0.5 IM SUSP
0.5000 mL | Freq: Once | INTRAMUSCULAR | Status: AC
  Administered 2011-04-22: 0.5 mL via INTRAMUSCULAR
  Filled 2011-04-22: qty 0.5

## 2011-04-22 MED ORDER — ONDANSETRON HCL 4 MG/2ML IJ SOLN: 4.0000 mg | INTRAMUSCULAR | Status: DC | PRN

## 2011-04-22 MED ORDER — SIMETHICONE 80 MG PO CHEW: 80.0000 mg | CHEWABLE_TABLET | ORAL | Status: DC | PRN

## 2011-04-22 MED ORDER — BENZOCAINE-MENTHOL 20-0.5 % EX AERO: 1.0000 "application " | INHALATION_SPRAY | CUTANEOUS | Status: DC | PRN

## 2011-04-22 MED ORDER — DIPHENHYDRAMINE HCL 25 MG PO CAPS: 25.0000 mg | ORAL_CAPSULE | Freq: Four times a day (QID) | ORAL | Status: DC | PRN

## 2011-04-22 MED ORDER — WITCH HAZEL-GLYCERIN EX PADS: 1.0000 "application " | MEDICATED_PAD | CUTANEOUS | Status: DC | PRN

## 2011-04-22 MED ORDER — SENNOSIDES-DOCUSATE SODIUM 8.6-50 MG PO TABS
2.0000 | ORAL_TABLET | Freq: Every day | ORAL | Status: DC
  Administered 2011-04-22: 2 via ORAL

## 2011-04-22 MED ORDER — OXYCODONE-ACETAMINOPHEN 5-325 MG PO TABS
1.0000 | ORAL_TABLET | ORAL | Status: DC | PRN
  Administered 2011-04-22 (×3): 1 via ORAL
  Administered 2011-04-22: 2 via ORAL
  Administered 2011-04-23: 1 via ORAL
  Filled 2011-04-22: qty 2

## 2011-04-22 NOTE — Progress Notes (Signed)
  Post Partum Day 1 S/P spontaneous vaginal RH status/Rubella reviewed.  Feeding: breast Subjective: No HA, SOB, CP, F/C, breast symptoms. Normal vaginal bleeding, no clots.     Objective: BP 98/68  Pulse 80  Temp(Src) 98 F (36.7 C) (Oral)  Resp 18  Ht 4\' 8"  (1.422 m)  Wt 68.04 kg (150 lb)  BMI 33.63 kg/m2  SpO2 100%  Breastfeeding? Unknown   Physical Exam:  General: alert Lochia: appropriate Uterine Fundus: firm DVT Evaluation: No evidence of DVT seen on physical exam. Ext: No c/c/e  Basename 04/22/11 0535 04/21/11 1357  HGB 9.1* 10.0*  HCT 27.5* 30.9*      Assessment/Plan: 25 y.o.  PPD #1 .  normal postpartum exam Anemia stable Continue current postpartum care  Ambulate   LOS: 1 day   JACKSON-MOORE,Quintina Hakeem A 04/22/2011, 12:25 PM

## 2011-04-22 NOTE — Anesthesia Postprocedure Evaluation (Signed)
  Anesthesia Post-op Note  Patient: Judith Mcintosh  Procedure(s) Performed: * No procedures listed *  Patient Location: PACU and Mother/Baby  Anesthesia Type: Epidural  Level of Consciousness: awake, alert  and oriented  Airway and Oxygen Therapy: Patient Spontanous Breathing  Post-op Assessment: Patient's Cardiovascular Status Stable and Respiratory Function Stable  Post-op Vital Signs: Reviewed and stable  Complications: No apparent anesthesia complications

## 2011-04-22 NOTE — Progress Notes (Signed)
UR chart review completed.  

## 2011-04-23 MED ORDER — MEDROXYPROGESTERONE ACETATE 150 MG/ML IM SUSP
150.0000 mg | Freq: Once | INTRAMUSCULAR | Status: AC
  Administered 2011-04-23: 150 mg via INTRAMUSCULAR
  Filled 2011-04-23: qty 1

## 2011-04-23 MED ORDER — PRENATAL PLUS 27-1 MG PO TABS: 1.0000 | ORAL_TABLET | Freq: Every day | ORAL | Status: DC

## 2011-04-23 MED ORDER — OXYCODONE-ACETAMINOPHEN 5-325 MG PO TABS: 1.0000 | ORAL_TABLET | ORAL | Status: AC | PRN

## 2011-04-23 MED ORDER — IBUPROFEN 600 MG PO TABS: 600.0000 mg | ORAL_TABLET | Freq: Four times a day (QID) | ORAL | Status: AC

## 2011-04-23 NOTE — Progress Notes (Signed)
Patient discharged, staying as a Manufacturing systems engineer. Judith Mcintosh is a baby patient.

## 2011-04-23 NOTE — Progress Notes (Signed)
  Post Partum Day #2 S/P:spontaneous vaginal  RH status/Rubella reviewed.  Feeding: breast Subjective: No HA, SOB, CP, F/C, breast symptoms: No. Normal vaginal bleeding, no clots.     Objective:  Blood pressure 87/48, pulse 75, temperature 98.3 F (36.8 C), temperature source Oral, resp. rate 18, height 4\' 8"  (1.422 m), weight 68.04 kg (150 lb), SpO2 100.00%, unknown if currently breastfeeding.   Physical Exam:  General: alert Lochia: appropriate Uterine Fundus: firm DVT Evaluation: No evidence of DVT seen on physical exam. Ext: No c/c/e  Basename 04/22/11 0535 04/21/11 1357  HGB 9.1* 10.0*  HCT 27.5* 30.9*    Assessment/Plan: 25 y.o.  PPD # 2 .  normal postpartum exam Continue current postpartum care D/C home   LOS: 2 days   JACKSON-MOORE,Tywaun Hiltner A 04/23/2011, 10:40 AM

## 2011-04-23 NOTE — Discharge Summary (Signed)
  Obstetric Discharge Summary Reason for Admission: onset of labor Prenatal Procedures: none Intrapartum Procedures: spontaneous vaginal delivery Postpartum Procedures: none Complications-Operative and Postpartum: none  Hemoglobin  Date Value Range Status  04/22/2011 9.1* 12.0-15.0 (g/dL) Final     HCT  Date Value Range Status  04/22/2011 27.5* 36.0-46.0 (%) Final    Discharge Diagnoses: Term Pregnancy-delivered  Discharge Information: Date: 04/23/2011 Activity: pelvic rest Diet: routine Medications:  Prior to Admission medications   Medication Sig Start Date End Date Taking? Authorizing Provider  ibuprofen (ADVIL,MOTRIN) 600 MG tablet Take 1 tablet (600 mg total) by mouth every 6 (six) hours. 04/23/11 05/03/11  Roseanna Rainbow, MD  oxyCODONE-acetaminophen (PERCOCET) 5-325 MG per tablet Take 1-2 tablets by mouth every 3 (three) hours as needed (moderate - severe pain). 04/23/11 05/03/11  Roseanna Rainbow, MD  prenatal vitamin w/FE, FA (PRENATAL 1 + 1) 27-1 MG TABS Take 1 tablet by mouth daily. 04/23/11   Roseanna Rainbow, MD   Condition: stable Instructions: refer to routine discharge instructions Discharge to: home Follow-up Information    Follow up with JACKSON-MOORE,Fredricka Kohrs A, MD. Call in 6 weeks.   Contact information:   [address] [place] [address] [phone]          Newborn Data: Live born  Information for the patient's newborn:  Wain, Girl Avory [[id number]]  female  Home with mother.  JACKSON-MOORE,Jancie Kercher A 04/23/2011, 10:56 AM

## 2012-09-26 ENCOUNTER — Ambulatory Visit: Payer: Self-pay | Admitting: Obstetrics

## 2012-10-25 ENCOUNTER — Ambulatory Visit: Payer: Self-pay | Admitting: Obstetrics

## 2014-03-04 ENCOUNTER — Other Ambulatory Visit: Payer: Medicaid Other

## 2014-03-26 ENCOUNTER — Other Ambulatory Visit (INDEPENDENT_AMBULATORY_CARE_PROVIDER_SITE_OTHER): Payer: Medicaid Other

## 2014-03-26 VITALS — BP 110/71 | HR 92 | Temp 97.6°F | Ht <= 58 in | Wt 158.0 lb

## 2014-03-26 DIAGNOSIS — Z3200 Encounter for pregnancy test, result unknown: Secondary | ICD-10-CM

## 2014-03-26 LAB — POCT URINE PREGNANCY: PREG TEST UR: POSITIVE

## 2014-03-26 NOTE — Progress Notes (Signed)
Patient in office today for a pregnancy test. Patient states she has had a positive home pregnancy test about 1 month ago. Pregnancy Test in office is positive. Patient states this is an unintended pregnancy but she does wish to continue the pregnancy. Prenatal vitamin samples given. Patient will schedule NOB.   BP 110/71  Pulse 92  Temp(Src) 97.6 F (36.4 C)  Ht 4\' 8"  (1.422 m)  Wt 158 lb (71.668 kg)  BMI 35.44 kg/m2  LMP 01/16/2014

## 2014-03-28 ENCOUNTER — Telehealth: Payer: Self-pay | Admitting: Obstetrics

## 2014-03-28 NOTE — Telephone Encounter (Signed)
Attempt to call pt to Sch an appt as a NOB but, phone is disconnect.  Marines HilbertJackson

## 2014-04-21 ENCOUNTER — Encounter: Payer: Self-pay | Admitting: Obstetrics

## 2014-04-21 ENCOUNTER — Ambulatory Visit (INDEPENDENT_AMBULATORY_CARE_PROVIDER_SITE_OTHER): Payer: Medicaid Other | Admitting: Obstetrics

## 2014-04-21 ENCOUNTER — Other Ambulatory Visit: Payer: Self-pay | Admitting: Obstetrics

## 2014-04-21 VITALS — BP 103/68 | HR 86 | Temp 98.4°F | Wt 156.0 lb

## 2014-04-21 DIAGNOSIS — Z36 Encounter for antenatal screening of mother: Secondary | ICD-10-CM

## 2014-04-21 DIAGNOSIS — Z3687 Encounter for antenatal screening for uncertain dates: Secondary | ICD-10-CM

## 2014-04-21 DIAGNOSIS — Z3490 Encounter for supervision of normal pregnancy, unspecified, unspecified trimester: Secondary | ICD-10-CM

## 2014-04-21 LAB — POCT URINALYSIS DIPSTICK
Glucose, UA: NEGATIVE
Nitrite, UA: NEGATIVE
PH UA: 7
RBC UA: NEGATIVE
SPEC GRAV UA: 1.01

## 2014-04-21 LAB — OB RESULTS CONSOLE GC/CHLAMYDIA
Chlamydia: NEGATIVE
Gonorrhea: NEGATIVE

## 2014-04-21 LAB — TSH: TSH: 0.348 u[IU]/mL — ABNORMAL LOW (ref 0.350–4.500)

## 2014-04-21 NOTE — Patient Instructions (Signed)
Morning Sickness °Morning sickness is when you feel sick to your stomach (nauseous) during pregnancy. You may feel sick to your stomach and throw up (vomit). You may feel sick in the morning, but you can feel this way any time of day. Some women feel very sick to their stomach and cannot stop throwing up (hyperemesis gravidarum). °HOME CARE °· Only take medicines as told by your doctor. °· Take multivitamins as told by your doctor. Taking multivitamins before getting pregnant can stop or lessen the harshness of morning sickness. °· Eat dry toast or unsalted crackers before getting out of bed. °· Eat 5 to 6 small meals a day. °· Eat dry and bland foods like rice and baked potatoes. °· Do not drink liquids with meals. Drink between meals. °· Do not eat greasy, fatty, or spicy foods. °· Have someone cook for you if the smell of food causes you to feel sick or throw up. °· If you feel sick to your stomach after taking prenatal vitamins, take them at night or with a snack. °· Eat protein when you need a snack (nuts, yogurt, cheese). °· Eat unsweetened gelatins for dessert. °· Wear a bracelet used for sea sickness (acupressure wristband). °· Go to a doctor that puts thin needles into certain body points (acupuncture) to improve how you feel. °· Do not smoke. °· Use a humidifier to keep the air in your house free of odors. °· Get lots of fresh air. °GET HELP IF: °· You need medicine to feel better. °· You feel dizzy or lightheaded. °· You are losing weight. °GET HELP RIGHT AWAY IF:  °· You feel very sick to your stomach and cannot stop throwing up. °· You pass out (faint). °MAKE SURE YOU: °· Understand these instructions. °· Will watch your condition. °· Will get help right away if you are not doing well or get worse. °Document Released: 07/14/2004 Document Revised: 06/11/2013 Document Reviewed: 11/21/2012 °ExitCare® Patient Information ©2015 [employer]. This information is not intended to replace advice given to you by  your health care provider. Make sure you discuss any questions you have with your health care provider. ° °

## 2014-04-21 NOTE — Progress Notes (Addendum)
Subjective:    Judith Mcintosh is being seen today for her first obstetrical visit.  This is not a planned pregnancy. She is at 2049w4d gestation. Her obstetrical history is significant for none. Relationship with FOB: significant other, not living together. Patient does intend to breast feed. Pregnancy history fully reviewed.  The information documented in the HPI was reviewed and verified.  Menstrual History: OB History    Gravida Para Term Preterm AB TAB SAB Ectopic Multiple Living   4 3 2 1   0 0 0 0 3       Patient's last menstrual period was 01/16/2014 (approximate).    Past Medical History  Diagnosis Date  . Abnormal Pap smear 2005    LEEP  . Headache(784.0) Migraine    Past Surgical History  Procedure Laterality Date  . Leep  2005  . No past surgeries       (Not in a hospital admission) No Known Allergies  History  Substance Use Topics  . Smoking status: Never Smoker   . Smokeless tobacco: Not on file  . Alcohol Use: No    History reviewed. No pertinent family history.   Review of Systems Constitutional: negative for weight loss Gastrointestinal: negative for vomiting Genitourinary:negative for genital lesions and vaginal discharge and dysuria Musculoskeletal:negative for back pain Behavioral/Psych: negative for abusive relationship, depression, illegal drug usage and tobacco use    Objective:    BP 103/68 mmHg  Pulse 86  Temp(Src) 98.4 F (36.9 C)  Wt 156 lb (70.761 kg)  LMP 01/16/2014 (Approximate) General Appearance:    Alert, cooperative, no distress, appears stated age  Head:    Normocephalic, without obvious abnormality, atraumatic  Eyes:    PERRL, conjunctiva/corneas clear, EOM's intact, fundi    benign, both eyes  Ears:    Normal TM's and external ear canals, both ears  Nose:   Nares normal, septum midline, mucosa normal, no drainage    or sinus tenderness  Throat:   Lips, mucosa, and tongue normal; teeth and gums normal  Neck:   Supple,  symmetrical, trachea midline, no adenopathy;    thyroid:  no enlargement/tenderness/nodules; no carotid   bruit or JVD  Back:     Symmetric, no curvature, ROM normal, no CVA tenderness  Lungs:     Clear to auscultation bilaterally, respirations unlabored  Chest Wall:    No tenderness or deformity   Heart:    Regular rate and rhythm, S1 and S2 normal, no murmur, rub   or gallop  Breast Exam:    No tenderness, masses, or nipple abnormality  Abdomen:     Soft, non-tender, bowel sounds active all four quadrants,    no masses, no organomegaly  Genitalia:    Normal female without lesion, discharge or tenderness  Extremities:   Extremities normal, atraumatic, no cyanosis or edema  Pulses:   2+ and symmetric all extremities  Skin:   Skin color, texture, turgor normal, no rashes or lesions  Lymph nodes:   Cervical, supraclavicular, and axillary nodes normal  Neurologic:   CNII-XII intact, normal strength, sensation and reflexes    throughout      Lab Review Urine pregnancy test Labs reviewed yes Radiologic studies reviewed no Assessment:    Pregnancy at 3749w4d weeks    Plan:      Prenatal vitamins.  Counseling provided regarding continued use of seat belts, cessation of alcohol consumption, smoking or use of illicit drugs; infection precautions i.e., influenza/TDAP immunizations, toxoplasmosis,CMV, parvovirus, listeria and varicella;  workplace safety, exercise during pregnancy; routine dental care, safe medications, sexual activity, hot tubs, saunas, pools, travel, caffeine use, fish and methlymercury, potential toxins, hair treatments, varicose veins Weight gain recommendations per IOM guidelines reviewed: underweight/BMI< 18.5--> gain 28 - 40 lbs; normal weight/BMI 18.5 - 24.9--> gain 25 - 35 lbs; overweight/BMI 25 - 29.9--> gain 15 - 25 lbs; obese/BMI >30->gain  11 - 20 lbs Problem list reviewed and updated. FIRST/CF mutation testing/NIPT/QUAD SCREEN/fragile X/Ashkenazi Jewish population  testing/Spinal muscular atrophy discussed: requested. Role of ultrasound in pregnancy discussed; fetal survey: requested. Amniocentesis discussed: not indicated. VBAC calculator score: VBAC consent form provided No orders of the defined types were placed in this encounter.   Orders Placed This Encounter  Procedures  . WET PREP BY MOLECULAR PROBE  . GC/Chlamydia Probe Amp  . Culture, OB Urine  . US OB Comp Less 14 Wks    Standing Status: Future     Number of Occurrences:      Standing Expiration Date: 06/22/2015    Order Specific Question:  Reason for Exam (SYMPTOM  OR DIAGNOSIS REQUIRED)    Answer:  unsure lmp    Order Specific Question:  Preferred imaging location?    [facility]  . Obstetric panel  . HIV antibody  . Hemoglobinopathy evaluation  . Varicella zoster antibody, IgG  . Vit D  25 hydroxy (rtn osteoporosis monitoring)  . TSH  . AFP, Quad Screen    Standing Status: Future     Number of Occurrences:      Standing Expiration Date: 04/22/2015    Order Specific Question:  Repeat Sample    Answer:  No    Order Specific Question:  Gest Age at U/S (Wk.Dy)    Answer:  13.4 weeks    Order Specific Question:  Number of Fetuses    Answer:  1    Order Specific Question:  Hx of OSB/NTD?    Answer:  No    Order Specific Question:  Maternal IDDM (insulin-dependent diabetes mellitus)    Answer:  No  . POCT urinalysis dipstick    Follow up in 4 weeks.

## 2014-04-22 LAB — WET PREP BY MOLECULAR PROBE
Candida species: NEGATIVE
Gardnerella vaginalis: POSITIVE — AB
Trichomonas vaginosis: NEGATIVE

## 2014-04-22 LAB — OBSTETRIC PANEL
Antibody Screen: NEGATIVE
BASOS ABS: 0 10*3/uL (ref 0.0–0.1)
Basophils Relative: 0 % (ref 0–1)
EOS PCT: 1 % (ref 0–5)
Eosinophils Absolute: 0.1 10*3/uL (ref 0.0–0.7)
HCT: 33.1 % — ABNORMAL LOW (ref 36.0–46.0)
Hemoglobin: 11.7 g/dL — ABNORMAL LOW (ref 12.0–15.0)
Hepatitis B Surface Ag: NEGATIVE
LYMPHS PCT: 35 % (ref 12–46)
Lymphs Abs: 2.3 10*3/uL (ref 0.7–4.0)
MCH: 31.5 pg (ref 26.0–34.0)
MCHC: 35.3 g/dL (ref 30.0–36.0)
MCV: 89 fL (ref 78.0–100.0)
Monocytes Absolute: 0.5 10*3/uL (ref 0.1–1.0)
Monocytes Relative: 8 % (ref 3–12)
Neutro Abs: 3.8 10*3/uL (ref 1.7–7.7)
Neutrophils Relative %: 56 % (ref 43–77)
PLATELETS: 311 10*3/uL (ref 150–400)
RBC: 3.72 MIL/uL — ABNORMAL LOW (ref 3.87–5.11)
RDW: 12.6 % (ref 11.5–15.5)
RUBELLA: 2.97 {index} — AB (ref <0.90)
Rh Type: POSITIVE
WBC: 6.7 10*3/uL (ref 4.0–10.5)

## 2014-04-22 LAB — VITAMIN D 25 HYDROXY (VIT D DEFICIENCY, FRACTURES): VIT D 25 HYDROXY: 21 ng/mL — AB (ref 30–89)

## 2014-04-22 LAB — PAP IG W/ RFLX HPV ASCU

## 2014-04-22 LAB — GC/CHLAMYDIA PROBE AMP
CT Probe RNA: NEGATIVE
GC Probe RNA: NEGATIVE

## 2014-04-22 LAB — HIV ANTIBODY (ROUTINE TESTING W REFLEX): HIV: NONREACTIVE

## 2014-04-22 LAB — VARICELLA ZOSTER ANTIBODY, IGG: Varicella IgG: 3119 Index — ABNORMAL HIGH (ref <135.00)

## 2014-04-23 ENCOUNTER — Other Ambulatory Visit: Payer: Self-pay | Admitting: Obstetrics

## 2014-04-23 DIAGNOSIS — B9689 Other specified bacterial agents as the cause of diseases classified elsewhere: Secondary | ICD-10-CM

## 2014-04-23 DIAGNOSIS — N760 Acute vaginitis: Principal | ICD-10-CM

## 2014-04-23 LAB — HEMOGLOBINOPATHY EVALUATION
HGB A2 QUANT: 3 % (ref 2.2–3.2)
HGB A: 97 % (ref 96.8–97.8)
HGB F QUANT: 0 % (ref 0.0–2.0)
HGB S QUANTITAION: 0 %
Hemoglobin Other: 0 %

## 2014-04-23 LAB — CULTURE, OB URINE
Colony Count: NO GROWTH
Organism ID, Bacteria: NO GROWTH

## 2014-04-23 MED ORDER — METRONIDAZOLE 500 MG PO TABS: 500.0000 mg | ORAL_TABLET | Freq: Two times a day (BID) | ORAL | Status: DC

## 2014-04-24 ENCOUNTER — Ambulatory Visit (HOSPITAL_COMMUNITY)
Admission: RE | Admit: 2014-04-24 | Discharge: 2014-04-24 | Disposition: A | Discharge status: Home / Self Care | Payer: Medicaid Other | Source: Ambulatory Visit | Attending: Obstetrics | Admitting: Obstetrics

## 2014-04-24 ENCOUNTER — Other Ambulatory Visit: Payer: Self-pay | Admitting: Obstetrics

## 2014-04-24 DIAGNOSIS — Z3687 Encounter for antenatal screening for uncertain dates: Secondary | ICD-10-CM

## 2014-04-24 DIAGNOSIS — Z36 Encounter for antenatal screening of mother: Secondary | ICD-10-CM | POA: Diagnosis not present

## 2014-04-24 DIAGNOSIS — Z3A14 14 weeks gestation of pregnancy: Secondary | ICD-10-CM | POA: Diagnosis not present

## 2014-05-05 ENCOUNTER — Encounter: Payer: Self-pay | Admitting: Obstetrics

## 2014-05-05 ENCOUNTER — Ambulatory Visit (INDEPENDENT_AMBULATORY_CARE_PROVIDER_SITE_OTHER): Payer: Medicaid Other | Admitting: Obstetrics

## 2014-05-05 VITALS — BP 107/70 | HR 93 | Temp 97.9°F | Wt 157.0 lb

## 2014-05-05 DIAGNOSIS — Z3482 Encounter for supervision of other normal pregnancy, second trimester: Secondary | ICD-10-CM

## 2014-05-05 DIAGNOSIS — G4459 Other complicated headache syndrome: Secondary | ICD-10-CM

## 2014-05-05 LAB — POCT URINALYSIS DIPSTICK
Bilirubin, UA: NEGATIVE
Blood, UA: NEGATIVE
GLUCOSE UA: NEGATIVE
Ketones, UA: NEGATIVE
NITRITE UA: NEGATIVE
Protein, UA: NEGATIVE
Spec Grav, UA: 1.02
UROBILINOGEN UA: 1
pH, UA: 6.5

## 2014-05-05 MED ORDER — BUTALBITAL-APAP-CAFFEINE 50-325-40 MG PO TABS: 2.0000 | ORAL_TABLET | Freq: Four times a day (QID) | ORAL | Status: AC | PRN

## 2014-05-05 NOTE — Progress Notes (Signed)
  Subjective:    Judith Mcintosh is a 28 y.o. female being seen today for her obstetrical visit. She is at 1042w4d gestation. Patient reports: no complaints.  Problem List Items Addressed This Visit    Other complicated headache syndrome   Relevant Medications      butalbital-acetaminophen-caffeine (FIORICET) tablet 50-325-40 mg     Other Visit Diagnoses    Encounter for supervision of other normal pregnancy in second trimester    -  Primary    Relevant Orders       POCT urinalysis dipstick (Completed)       US OB Comp + 14 Wk       AFP, Quad Screen      Patient Active Problem List   Diagnosis Date Noted  . Other complicated headache syndrome 05/05/2014  . Unsure of LMP (last menstrual period) as reason for ultrasound scan   . [redacted] weeks gestation of pregnancy   . IRRITABLE BOWEL SYNDROME 05/21/2008  . UTI 05/21/2008    Objective:     BP 107/70 mmHg  Pulse 93  Temp(Src) 97.9 F (36.6 C)  Wt 157 lb (71.215 kg)  LMP 01/16/2014 (Approximate) Uterine Size: Below umbilicus     Assessment:    Pregnancy @ 8142w4d  weeks Doing well    Plan:    Problem list reviewed and updated. Labs reviewed.  Follow up in 4 weeks. FIRST/CF mutation testing/NIPT/QUAD SCREEN/fragile X/Ashkenazi Jewish population testing/Spinal muscular atrophy discussed: requested. Role of ultrasound in pregnancy discussed; fetal survey: requested. Amniocentesis discussed: not indicated.

## 2014-05-07 LAB — AFP, QUAD SCREEN
AFP: 32.3 ng/mL
Age Alone: 1:830 {titer}
CURR GEST AGE: 15.4 wks.days
Down Syndrome Scr Risk Est: 1:38500 {titer}
HCG TOTAL: 17.25 [IU]/mL
INH: 132.2 pg/mL
INTERPRETATION-AFP: NEGATIVE
MOM FOR AFP: 0.91
MoM for INH: 0.75
MoM for hCG: 0.37
OPEN SPINA BIFIDA: NEGATIVE
Osb Risk: 1:36400 {titer}
Tri 18 Scr Risk Est: NEGATIVE
Trisomy 18 (Edward) Syndrome Interp.: 1:5420 {titer}
UE3 MOM: 0.96
uE3 Value: 0.71 ng/mL

## 2014-06-11 ENCOUNTER — Ambulatory Visit (HOSPITAL_COMMUNITY): Payer: Medicaid Other

## 2014-06-11 ENCOUNTER — Ambulatory Visit (HOSPITAL_COMMUNITY)
Admission: RE | Admit: 2014-06-11 | Discharge: 2014-06-11 | Disposition: A | Discharge status: Home / Self Care | Payer: Medicaid Other | Source: Ambulatory Visit | Attending: Obstetrics | Admitting: Obstetrics

## 2014-06-11 ENCOUNTER — Encounter: Payer: Medicaid Other | Admitting: Obstetrics

## 2014-06-11 ENCOUNTER — Other Ambulatory Visit: Payer: Self-pay | Admitting: Obstetrics

## 2014-06-11 DIAGNOSIS — Z3482 Encounter for supervision of other normal pregnancy, second trimester: Secondary | ICD-10-CM

## 2014-06-12 DIAGNOSIS — Z0489 Encounter for examination and observation for other specified reasons: Secondary | ICD-10-CM | POA: Insufficient documentation

## 2014-06-12 DIAGNOSIS — IMO0002 Reserved for concepts with insufficient information to code with codable children: Secondary | ICD-10-CM | POA: Insufficient documentation

## 2014-06-12 DIAGNOSIS — Z3A20 20 weeks gestation of pregnancy: Secondary | ICD-10-CM | POA: Insufficient documentation

## 2014-06-20 NOTE — L&D Delivery Note (Signed)
Delivery Note At 2:40 PM a viable female was delivered via  (Presentation: OA).  APGAR: 9, 9; weight  .    This is a 29 year old G 4 P3 who was admitted for Not in labor. and was an IOL for postdates with variable decels in MAU on 10/27/14. She progressed normally with epidural and pitocin augmentation to the second stage of labor.  She pushed for 5 min.  She delivered a viable infant female, cephalic, over an intact perineum.  A nuchal cord  was not identified. Infant placed on maternal abdomen. Umbilical cord was double clamped and cut.  Apgar scores were 9 and 9. The placenta delivered spontaneously, shultz, with a 3 vessel cord.  Inspection revealed no lacerations. The uterus was firm bleeding stable.  EBL was 200.    Placenta and umbilical artery blood gas were not sent.  Cord blood was obtained. There were no complications during the procedure.  Mom and baby skin to skin following delivery. Left in stable condition.   Placenta status: intact, 3 vessel Cord Anesthesia: Epidural  Episiotomy:  none Lacerations:  none Suture Repair: N/A Est. Blood Loss (mL):  200  Mom to postpartum.  Baby to Couplet care / Skin to Skin.  Roe Coombsachelle A Denney, CNM 10/28/2014, 2:58 PM

## 2014-06-24 ENCOUNTER — Ambulatory Visit (INDEPENDENT_AMBULATORY_CARE_PROVIDER_SITE_OTHER): Payer: Medicaid Other | Admitting: Obstetrics

## 2014-06-24 VITALS — BP 108/64 | HR 97 | Temp 98.1°F | Wt 155.0 lb

## 2014-06-24 DIAGNOSIS — Z3482 Encounter for supervision of other normal pregnancy, second trimester: Secondary | ICD-10-CM

## 2014-06-24 LAB — POCT URINALYSIS DIPSTICK
BILIRUBIN UA: NEGATIVE
Blood, UA: NEGATIVE
GLUCOSE UA: NEGATIVE
Ketones, UA: NEGATIVE
Leukocytes, UA: NEGATIVE
Nitrite, UA: NEGATIVE
Spec Grav, UA: 1.025
Urobilinogen, UA: NEGATIVE
pH, UA: 5

## 2014-06-25 ENCOUNTER — Encounter: Payer: Self-pay | Admitting: Obstetrics

## 2014-06-25 NOTE — Progress Notes (Signed)
Subjective:    Judith Mcintosh is a 29 y.o. female being seen today for her obstetrical visit. She is at 3361w6d gestation. Patient reports: no complaints . Fetal movement: normal.  Problem List Items Addressed This Visit    None    Visit Diagnoses    Encounter for supervision of other normal pregnancy in second trimester    -  Primary    Relevant Orders       POCT urinalysis dipstick (Completed)      Patient Active Problem List   Diagnosis Date Noted  . [redacted] weeks gestation of pregnancy   . Evaluate anatomy not seen on prior sonogram   . Other complicated headache syndrome 05/05/2014  . Unsure of LMP (last menstrual period) as reason for ultrasound scan   . [redacted] weeks gestation of pregnancy   . IRRITABLE BOWEL SYNDROME 05/21/2008  . UTI 05/21/2008   Objective:    BP 108/64 mmHg  Pulse 97  Temp(Src) 98.1 F (36.7 C)  Wt 155 lb (70.308 kg)  LMP 01/16/2014 (Approximate) FHT: 150 BPM  Uterine Size: size equals dates     Assessment:    Pregnancy @ 7461w6d    Plan:    OBGCT: ordered for next visit.  Labs, problem list reviewed and updated 2 hr GTT planned Follow up in 4 weeks.

## 2014-07-22 ENCOUNTER — Encounter: Payer: Self-pay | Admitting: Obstetrics

## 2014-07-22 ENCOUNTER — Other Ambulatory Visit: Payer: Medicaid Other

## 2014-07-22 ENCOUNTER — Ambulatory Visit (INDEPENDENT_AMBULATORY_CARE_PROVIDER_SITE_OTHER): Payer: Medicaid Other | Admitting: Obstetrics

## 2014-07-22 VITALS — BP 102/69 | HR 87 | Temp 98.7°F | Wt 155.0 lb

## 2014-07-22 DIAGNOSIS — Z3482 Encounter for supervision of other normal pregnancy, second trimester: Secondary | ICD-10-CM

## 2014-07-22 LAB — CBC
HCT: 35.1 % — ABNORMAL LOW (ref 36.0–46.0)
HEMOGLOBIN: 11.8 g/dL — AB (ref 12.0–15.0)
MCH: 31.1 pg (ref 26.0–34.0)
MCHC: 33.6 g/dL (ref 30.0–36.0)
MCV: 92.6 fL (ref 78.0–100.0)
MPV: 9.1 fL (ref 8.6–12.4)
Platelets: 281 10*3/uL (ref 150–400)
RBC: 3.79 MIL/uL — ABNORMAL LOW (ref 3.87–5.11)
RDW: 12.9 % (ref 11.5–15.5)
WBC: 5.4 10*3/uL (ref 4.0–10.5)

## 2014-07-22 LAB — POCT URINALYSIS DIPSTICK
Bilirubin, UA: NEGATIVE
Blood, UA: NEGATIVE
Glucose, UA: NEGATIVE
KETONES UA: NEGATIVE
Leukocytes, UA: NEGATIVE
Nitrite, UA: NEGATIVE
PROTEIN UA: NEGATIVE
Spec Grav, UA: 1.01
UROBILINOGEN UA: NEGATIVE
pH, UA: 5

## 2014-07-22 NOTE — Progress Notes (Signed)
Patient reports she feels pressure at the bottom of her pelvis- but it has stayed the same.

## 2014-07-22 NOTE — Progress Notes (Signed)
Subjective:    Judith Mcintosh is a 29 y.o. female being seen today for her obstetrical visit. She is at 6310w5d gestation. Patient reports: no complaints . Fetal movement: normal.  Problem List Items Addressed This Visit    None    Visit Diagnoses    Encounter for supervision of other normal pregnancy in second trimester    -  Primary    Relevant Orders    Glucose Tolerance, 2 Hours w/1 Hour    CBC    HIV antibody    RPR    POCT urinalysis dipstick      Patient Active Problem List   Diagnosis Date Noted  . [redacted] weeks gestation of pregnancy   . Evaluate anatomy not seen on prior sonogram   . Other complicated headache syndrome 05/05/2014  . Unsure of LMP (last menstrual period) as reason for ultrasound scan   . [redacted] weeks gestation of pregnancy   . IRRITABLE BOWEL SYNDROME 05/21/2008  . UTI 05/21/2008   Objective:    BP 102/69 mmHg  Pulse 87  Temp(Src) 98.7 F (37.1 C)  Wt 155 lb (70.308 kg)  LMP 01/16/2014 (Approximate) FHT: 150 BPM  Uterine Size: size equals dates     Assessment:    Pregnancy @ 2910w5d    Plan:    OBGCT: discussed.  Labs, problem list reviewed and updated 2 hr GTT planned Follow up in 2 weeks.

## 2014-07-23 LAB — GLUCOSE TOLERANCE, 2 HOURS W/ 1HR
GLUCOSE, 2 HOUR: 105 mg/dL (ref 70–139)
GLUCOSE, FASTING: 67 mg/dL — AB (ref 70–99)
GLUCOSE: 93 mg/dL (ref 70–170)

## 2014-07-23 LAB — HIV ANTIBODY (ROUTINE TESTING W REFLEX): HIV: NONREACTIVE

## 2014-07-23 LAB — RPR

## 2014-07-30 ENCOUNTER — Encounter (HOSPITAL_COMMUNITY): Payer: Self-pay | Admitting: *Deleted

## 2014-07-30 ENCOUNTER — Inpatient Hospital Stay (HOSPITAL_COMMUNITY)
Admission: AD | Admit: 2014-07-30 | Discharge: 2014-07-31 | Disposition: A | Discharge status: Home / Self Care | Payer: Medicaid Other | Source: Ambulatory Visit | Attending: Obstetrics | Admitting: Obstetrics

## 2014-07-30 DIAGNOSIS — O9989 Other specified diseases and conditions complicating pregnancy, childbirth and the puerperium: Secondary | ICD-10-CM | POA: Diagnosis not present

## 2014-07-30 DIAGNOSIS — G43909 Migraine, unspecified, not intractable, without status migrainosus: Secondary | ICD-10-CM | POA: Diagnosis not present

## 2014-07-30 DIAGNOSIS — O26893 Other specified pregnancy related conditions, third trimester: Secondary | ICD-10-CM

## 2014-07-30 DIAGNOSIS — R52 Pain, unspecified: Secondary | ICD-10-CM | POA: Insufficient documentation

## 2014-07-30 DIAGNOSIS — R109 Unspecified abdominal pain: Secondary | ICD-10-CM | POA: Diagnosis present

## 2014-07-30 DIAGNOSIS — R51 Headache: Secondary | ICD-10-CM

## 2014-07-30 DIAGNOSIS — Z3A28 28 weeks gestation of pregnancy: Secondary | ICD-10-CM | POA: Insufficient documentation

## 2014-07-30 LAB — URINALYSIS, ROUTINE W REFLEX MICROSCOPIC
Bilirubin Urine: NEGATIVE
GLUCOSE, UA: NEGATIVE mg/dL
Hgb urine dipstick: NEGATIVE
Ketones, ur: NEGATIVE mg/dL
Leukocytes, UA: NEGATIVE
Nitrite: NEGATIVE
PH: 6 (ref 5.0–8.0)
PROTEIN: NEGATIVE mg/dL
Specific Gravity, Urine: 1.015 (ref 1.005–1.030)
Urobilinogen, UA: 1 mg/dL (ref 0.0–1.0)

## 2014-07-30 NOTE — MAU Note (Signed)
Pt states she started with the body aches and cramping yesterday.

## 2014-07-30 NOTE — Progress Notes (Signed)
Pt states she is just having body aches and a headache

## 2014-07-30 NOTE — Progress Notes (Signed)
Pt has a headache that she rates a 5. Pt states she has had a headache since this morning.

## 2014-07-31 MED ORDER — DIPHENHYDRAMINE HCL 50 MG/ML IJ SOLN
25.0000 mg | Freq: Once | INTRAMUSCULAR | Status: AC
  Administered 2014-07-31: 25 mg via INTRAVENOUS
  Filled 2014-07-31: qty 1

## 2014-07-31 MED ORDER — DEXAMETHASONE SODIUM PHOSPHATE 10 MG/ML IJ SOLN
10.0000 mg | Freq: Once | INTRAMUSCULAR | Status: AC
  Administered 2014-07-31: 10 mg via INTRAVENOUS
  Filled 2014-07-31: qty 1

## 2014-07-31 MED ORDER — METOCLOPRAMIDE HCL 5 MG/ML IJ SOLN
10.0000 mg | Freq: Once | INTRAMUSCULAR | Status: AC
  Administered 2014-07-31: 10 mg via INTRAVENOUS
  Filled 2014-07-31: qty 2

## 2014-07-31 MED ORDER — SODIUM CHLORIDE 0.9 % IV SOLN
INTRAVENOUS | Status: DC
  Administered 2014-07-31: 01:00:00 via INTRAVENOUS

## 2014-07-31 NOTE — MAU Provider Note (Signed)
CSN: [id number]     Arrival date & time 07/30/14  2209 History   None    Chief Complaint  Patient presents with  . Abdominal Pain  . Generalized Body Aches     (Consider location/radiation/quality/duration/timing/severity/associated sxs/prior Treatment) Patient is a 29 y.o. female presenting with abdominal pain. The history is provided by the patient.  Abdominal Pain The primary symptoms of the illness include abdominal pain.   Judith Mcintosh is a 29 y.o. [phone] @ 3357w0d gestation who presents to the MAU with abdominal pain that she thinks may be contractions. She also complains of generalized body aches and headache. She states that she has frequent headaches and this feels the same. She has Fioricet that Dr. Clearance CootsHarper has prescribed for her but she took it today and it did not help. She rates her headache as 5/10.   Past Medical History  Diagnosis Date  . Abnormal Pap smear 2005    LEEP  . Headache(784.0) Migraine   Past Surgical History  Procedure Laterality Date  . Leep  2005  . No past surgeries     History reviewed. No pertinent family history. History  Substance Use Topics  . Smoking status: Never Smoker   . Smokeless tobacco: Not on file  . Alcohol Use: No   OB History    Gravida Para Term Preterm AB TAB SAB Ectopic Multiple Living   4 3 2 1   0 0 0 0 3     Review of Systems  Gastrointestinal: Positive for abdominal pain.      Allergies  Review of patient's allergies indicates no known allergies.  Home Medications   Prior to Admission medications   Medication Sig Start Date End Date Taking? Authorizing Provider  butalbital-acetaminophen-caffeine (FIORICET) 50-325-40 MG per tablet Take 2 tablets by mouth every 6 (six) hours as needed for headache. 05/05/14 05/05/15  Brock Badharles A Harper, MD  prenatal vitamin w/FE, FA (PRENATAL 1 + 1) 27-1 MG TABS Take 1 tablet by mouth daily. Patient not taking: Reported on 06/24/2014 04/23/11   Antionette CharLisa Jackson-Moore, MD   BP  102/58 mmHg  Pulse 90  Temp(Src) 98.2 F (36.8 C) (Oral)  Resp 18  SpO2 100%  LMP 01/16/2014 (Approximate) Physical Exam  Constitutional: She is oriented to person, place, and time. She appears well-developed and well-nourished. No distress.  HENT:  Head: Normocephalic and atraumatic.  Mouth/Throat: Uvula is midline, oropharynx is clear and moist and mucous membranes are normal.  Eyes: Conjunctivae and EOM are normal.  Neck: Normal range of motion. Neck supple.  Cardiovascular: Normal rate and regular rhythm.   Pulmonary/Chest: Effort normal and breath sounds normal.  Abdominal: Soft. There is no tenderness.  Musculoskeletal: Normal range of motion.  Neurological: She is alert and oriented to person, place, and time. She has normal strength. No cranial nerve deficit or sensory deficit. Gait normal.  Skin: Skin is warm and dry.  Psychiatric: She has a normal mood and affect. Her behavior is normal. Thought content normal.  Nursing note and vitals reviewed.   ED Course  Procedures  Results for orders placed or performed during the hospital encounter of 07/30/14 (from the past 24 hour(s))  Urinalysis, Routine w reflex microscopic     Status: None   Collection Time: 07/30/14 10:40 PM  Result Value Ref Range   Color, Urine YELLOW YELLOW   APPearance CLEAR CLEAR   Specific Gravity, Urine 1.015 1.005 - 1.030   pH 6.0 5.0 - 8.0   Glucose, UA NEGATIVE  NEGATIVE mg/dL   Hgb urine dipstick NEGATIVE NEGATIVE   Bilirubin Urine NEGATIVE NEGATIVE   Ketones, ur NEGATIVE NEGATIVE mg/dL   Protein, ur NEGATIVE NEGATIVE mg/dL   Urobilinogen, UA 1.0 0.0 - 1.0 mg/dL   Nitrite NEGATIVE NEGATIVE   Leukocytes, UA NEGATIVE NEGATIVE    Consult with Dr. Clearance Coots and will give patient migraine cocktail of Decadron 10 mg IV, Reglan 10 mg IV and Benadryl 25 mg IV with IV fluids of Normal Saline.  0205 am after medications and IV fluids patient's headache is completely gone and she is feeling much better.  She has been eating and drinking.   EFM shows no contractions and reactive tracing.  MDM  29 y.o. female with hx of headaches and tonight feels the same as usual and generalized body aches. Stable for discharge without headache and body aches feeling better. Patient does not appear toxic and has no meningeal signs and not neuro deficits. She will follow up with Dr. Clearance Coots or return here as needed. Discussed with the patient and all questioned fully answered.   Final diagnoses:  Headache in pregnancy, third trimester  Body aches

## 2014-07-31 NOTE — MAU Note (Signed)
Pt sleeping after IV started and pt given medication for headache

## 2014-07-31 NOTE — Discharge Instructions (Signed)
Follow up with Dr. Clearance CootsHarper if your body aches continue. Return here as needed.

## 2014-08-05 ENCOUNTER — Encounter: Payer: Medicaid Other | Admitting: Obstetrics

## 2014-08-07 ENCOUNTER — Encounter: Payer: Medicaid Other | Admitting: Obstetrics

## 2014-08-08 ENCOUNTER — Ambulatory Visit (INDEPENDENT_AMBULATORY_CARE_PROVIDER_SITE_OTHER): Payer: Medicaid Other | Admitting: Obstetrics

## 2014-08-08 ENCOUNTER — Encounter: Payer: Self-pay | Admitting: Obstetrics

## 2014-08-08 VITALS — BP 112/67 | HR 92 | Temp 98.4°F | Wt 156.0 lb

## 2014-08-08 DIAGNOSIS — Z3483 Encounter for supervision of other normal pregnancy, third trimester: Secondary | ICD-10-CM

## 2014-08-08 DIAGNOSIS — Z331 Pregnant state, incidental: Secondary | ICD-10-CM

## 2014-08-08 DIAGNOSIS — Z3490 Encounter for supervision of normal pregnancy, unspecified, unspecified trimester: Secondary | ICD-10-CM

## 2014-08-08 LAB — POCT URINALYSIS DIPSTICK
GLUCOSE UA: NEGATIVE
Ketones, UA: NEGATIVE
LEUKOCYTES UA: NEGATIVE
NITRITE UA: NEGATIVE
RBC UA: NEGATIVE
SPEC GRAV UA: 1.02
pH, UA: 5

## 2014-08-08 MED ORDER — VITAFOL-OB PO TABS: 1.0000 | ORAL_TABLET | Freq: Every day | ORAL | Status: DC

## 2014-08-08 NOTE — Progress Notes (Signed)
Patient is having some swelling occasional- patient states no change in normal pressure and discomfort.

## 2014-08-08 NOTE — Progress Notes (Signed)
Subjective:    Judith Mcintosh is a 29 y.o. female being seen today for her obstetrical visit. She is at 9255w1d gestation. Patient reports no complaints. Fetal movement: normal.  Problem List Items Addressed This Visit    None    Visit Diagnoses    Pregnancy    -  Primary    Relevant Orders    POCT urinalysis dipstick (Completed)      Patient Active Problem List   Diagnosis Date Noted  . [redacted] weeks gestation of pregnancy   . Evaluate anatomy not seen on prior sonogram   . Other complicated headache syndrome 05/05/2014  . Unsure of LMP (last menstrual period) as reason for ultrasound scan   . [redacted] weeks gestation of pregnancy   . IRRITABLE BOWEL SYNDROME 05/21/2008  . UTI 05/21/2008   Objective:    BP 112/67 mmHg  Pulse 92  Temp(Src) 98.4 F (36.9 C)  Wt 156 lb (70.761 kg)  LMP 01/16/2014 (Approximate) FHT:  150 BPM  Uterine Size: size equals dates  Presentation: unsure     Assessment:    Pregnancy @ 3355w1d weeks   Plan:     labs reviewed, problem list updated Consent signed. GBS sent TDAP offered  Rhogam given for RH negative Pediatrician: discussed. Infant feeding: plans to breastfeed. Maternity leave: discussed. Cigarette smoking: never smoked. Orders Placed This Encounter  Procedures  . POCT urinalysis dipstick   Meds ordered this encounter  Medications  . Prenatal Vit-Fe Fumarate-FA (VITAFOL-OB) TABS    Sig: Take 1 tablet by mouth daily.    Dispense:  30 each    Refill:  11   Follow up in 2 Weeks.

## 2014-08-15 ENCOUNTER — Inpatient Hospital Stay (HOSPITAL_COMMUNITY): Admission: AD | Admit: 2014-08-15 | Payer: Medicaid Other | Source: Ambulatory Visit | Admitting: Obstetrics

## 2014-08-25 ENCOUNTER — Ambulatory Visit (INDEPENDENT_AMBULATORY_CARE_PROVIDER_SITE_OTHER): Payer: Medicaid Other | Admitting: Obstetrics

## 2014-08-25 VITALS — BP 105/66 | HR 90 | Temp 98.6°F | Wt 154.0 lb

## 2014-08-25 DIAGNOSIS — K219 Gastro-esophageal reflux disease without esophagitis: Secondary | ICD-10-CM

## 2014-08-25 DIAGNOSIS — Z3483 Encounter for supervision of other normal pregnancy, third trimester: Secondary | ICD-10-CM

## 2014-08-25 LAB — POCT URINALYSIS DIPSTICK
Bilirubin, UA: NEGATIVE
GLUCOSE UA: NEGATIVE
Ketones, UA: NEGATIVE
LEUKOCYTES UA: NEGATIVE
NITRITE UA: NEGATIVE
RBC UA: NEGATIVE
Spec Grav, UA: 1.02
UROBILINOGEN UA: 1
pH, UA: 6

## 2014-08-25 MED ORDER — OMEPRAZOLE 20 MG PO CPDR: 20.0000 mg | DELAYED_RELEASE_CAPSULE | Freq: Two times a day (BID) | ORAL | Status: DC

## 2014-08-26 ENCOUNTER — Encounter: Payer: Self-pay | Admitting: Obstetrics

## 2014-08-26 NOTE — Progress Notes (Signed)
Subjective:    Freddrick Marchatasha N Fulginiti is a 29 y.o. female being seen today for her obstetrical visit. She is at 2267w5d gestation. Patient reports no complaints. Fetal movement: normal.  Problem List Items Addressed This Visit    None    Visit Diagnoses    Encounter for supervision of other normal pregnancy in third trimester    -  Primary    Relevant Orders    POCT urinalysis dipstick (Completed)    GERD without esophagitis        Relevant Medications    omeprazole (PRILOSEC) capsule      Patient Active Problem List   Diagnosis Date Noted  . [redacted] weeks gestation of pregnancy   . Evaluate anatomy not seen on prior sonogram   . Other complicated headache syndrome 05/05/2014  . Unsure of LMP (last menstrual period) as reason for ultrasound scan   . [redacted] weeks gestation of pregnancy   . IRRITABLE BOWEL SYNDROME 05/21/2008  . UTI 05/21/2008   Objective:    BP 105/66 mmHg  Pulse 90  Temp(Src) 98.6 F (37 C)  Wt 154 lb (69.854 kg)  LMP 01/16/2014 (Approximate) FHT:  150 BPM  Uterine Size: size equals dates  Presentation: unsure     Assessment:    Pregnancy @ 5767w5d weeks   Plan:     labs reviewed, problem list updated Consent signed. GBS sent TDAP offered  Rhogam given for RH negative Pediatrician: discussed. Infant feeding: plans to breastfeed. Maternity leave: discussed. Cigarette smoking: never smoked. Orders Placed This Encounter  Procedures  . POCT urinalysis dipstick   Meds ordered this encounter  Medications  . omeprazole (PRILOSEC) 20 MG capsule    Sig: Take 1 capsule (20 mg total) by mouth 2 (two) times daily before a meal.    Dispense:  60 capsule    Refill:  5   Follow up in 2 Weeks.

## 2014-09-08 ENCOUNTER — Ambulatory Visit (INDEPENDENT_AMBULATORY_CARE_PROVIDER_SITE_OTHER): Payer: Medicaid Other | Admitting: Obstetrics

## 2014-09-08 ENCOUNTER — Encounter: Payer: Self-pay | Admitting: Obstetrics

## 2014-09-08 VITALS — BP 113/71 | HR 90 | Temp 98.3°F | Wt 154.0 lb

## 2014-09-08 DIAGNOSIS — Z3483 Encounter for supervision of other normal pregnancy, third trimester: Secondary | ICD-10-CM

## 2014-09-08 LAB — POCT URINALYSIS DIPSTICK
BILIRUBIN UA: NEGATIVE
GLUCOSE UA: NEGATIVE
Ketones, UA: NEGATIVE
Leukocytes, UA: NEGATIVE
NITRITE UA: NEGATIVE
Protein, UA: NEGATIVE
RBC UA: NEGATIVE
Spec Grav, UA: 1.015
UROBILINOGEN UA: NEGATIVE
pH, UA: 6

## 2014-09-08 NOTE — Progress Notes (Signed)
Subjective:    Judith Mcintosh is a 29 y.o. female being seen today for her obstetrical visit. She is at 5065w4d gestation. Patient reports no complaints. Fetal movement: normal.  Problem List Items Addressed This Visit    None    Visit Diagnoses    Encounter for supervision of other normal pregnancy in third trimester    -  Primary    Relevant Orders    POCT urinalysis dipstick (Completed)      Patient Active Problem List   Diagnosis Date Noted  . [redacted] weeks gestation of pregnancy   . Evaluate anatomy not seen on prior sonogram   . Other complicated headache syndrome 05/05/2014  . Unsure of LMP (last menstrual period) as reason for ultrasound scan   . [redacted] weeks gestation of pregnancy   . IRRITABLE BOWEL SYNDROME 05/21/2008  . UTI 05/21/2008   Objective:    BP 113/71 mmHg  Pulse 90  Temp(Src) 98.3 F (36.8 C)  Wt 154 lb (69.854 kg)  LMP 01/16/2014 (Approximate) FHT:  150 BPM  Uterine Size: size equals dates  Presentation: cephalic     Assessment:    Pregnancy @ 1765w4d weeks   Plan:     labs reviewed, problem list updated Consent signed. GBS sent TDAP offered  Rhogam given for RH negative Pediatrician: discussed. Infant feeding: plans to breastfeed. Maternity leave: discussed. Cigarette smoking: never smoked. Orders Placed This Encounter  Procedures  . POCT urinalysis dipstick   No orders of the defined types were placed in this encounter.   Follow up in 2 Weeks.

## 2014-09-22 ENCOUNTER — Encounter: Payer: Medicaid Other | Admitting: Obstetrics

## 2014-09-24 ENCOUNTER — Encounter: Payer: Self-pay | Admitting: Obstetrics

## 2014-09-24 ENCOUNTER — Ambulatory Visit (INDEPENDENT_AMBULATORY_CARE_PROVIDER_SITE_OTHER): Payer: Medicaid Other | Admitting: Obstetrics

## 2014-09-24 VITALS — BP 107/64 | HR 79 | Temp 98.1°F | Wt 159.8 lb

## 2014-09-24 DIAGNOSIS — Z3483 Encounter for supervision of other normal pregnancy, third trimester: Secondary | ICD-10-CM

## 2014-09-24 LAB — POCT URINALYSIS DIPSTICK
Bilirubin, UA: NEGATIVE
Blood, UA: NEGATIVE
Glucose, UA: NEGATIVE
Ketones, UA: NEGATIVE
Leukocytes, UA: NEGATIVE
Nitrite, UA: NEGATIVE
Protein, UA: NEGATIVE
Spec Grav, UA: 1.02
Urobilinogen, UA: 4
pH, UA: 6

## 2014-09-24 NOTE — Progress Notes (Signed)
Subjective:    Freddrick Marchatasha N Arts is a 29 y.o. female being seen today for her obstetrical visit. She is at 5660w6d gestation. Patient reports no complaints. Fetal movement: normal.  Problem List Items Addressed This Visit    None    Visit Diagnoses    Encounter for supervision of other normal pregnancy in third trimester    -  Primary    Relevant Orders    POCT urinalysis dipstick (Completed)      Patient Active Problem List   Diagnosis Date Noted  . [redacted] weeks gestation of pregnancy   . Evaluate anatomy not seen on prior sonogram   . Other complicated headache syndrome 05/05/2014  . Unsure of LMP (last menstrual period) as reason for ultrasound scan   . [redacted] weeks gestation of pregnancy   . IRRITABLE BOWEL SYNDROME 05/21/2008  . UTI 05/21/2008   Objective:    BP 107/64 mmHg  Pulse 79  Temp(Src) 98.1 F (36.7 C)  Wt 159 lb 12.8 oz (72.485 kg)  LMP 01/16/2014 (Approximate) FHT:  150 BPM  Uterine Size: size equals dates  Presentation: cephalic     Assessment:    Pregnancy @ 7360w6d weeks   Plan:     labs reviewed, problem list updated Consent signed. GBS sent TDAP offered  Rhogam given for RH negative Pediatrician: discussed. Infant feeding: plans to breastfeed. Maternity leave: discussed. Cigarette smoking: never smoked. Orders Placed This Encounter  Procedures  . POCT urinalysis dipstick   No orders of the defined types were placed in this encounter.   Follow up in 1 Week.

## 2014-10-01 ENCOUNTER — Ambulatory Visit (INDEPENDENT_AMBULATORY_CARE_PROVIDER_SITE_OTHER): Payer: Medicaid Other | Admitting: Certified Nurse Midwife

## 2014-10-01 VITALS — BP 112/76 | HR 69 | Temp 97.4°F | Wt 162.0 lb

## 2014-10-01 DIAGNOSIS — Z3483 Encounter for supervision of other normal pregnancy, third trimester: Secondary | ICD-10-CM | POA: Diagnosis not present

## 2014-10-01 LAB — POCT URINALYSIS DIPSTICK
Bilirubin, UA: NEGATIVE
Glucose, UA: NEGATIVE
Ketones, UA: NEGATIVE
Leukocytes, UA: NEGATIVE
Nitrite, UA: NEGATIVE
PH UA: 7
PROTEIN UA: NEGATIVE
RBC UA: NEGATIVE
SPEC GRAV UA: 1.01
UROBILINOGEN UA: NEGATIVE

## 2014-10-01 NOTE — Progress Notes (Signed)
Subjective:    Judith Mcintosh is a 29 y.o. female being seen today for her obstetrical visit. She is at 2633w6d gestation. Patient reports backache, no bleeding, no contractions, no cramping and occasional contractions. Fetal movement: normal.  Problem List Items Addressed This Visit    None    Visit Diagnoses    Encounter for supervision of other normal pregnancy in third trimester    -  Primary    Relevant Orders    POCT urinalysis dipstick (Completed)    Strep B DNA probe      Patient Active Problem List   Diagnosis Date Noted  . [redacted] weeks gestation of pregnancy   . Evaluate anatomy not seen on prior sonogram   . Other complicated headache syndrome 05/05/2014  . Unsure of LMP (last menstrual period) as reason for ultrasound scan   . [redacted] weeks gestation of pregnancy   . IRRITABLE BOWEL SYNDROME 05/21/2008  . UTI 05/21/2008   Objective:    BP 112/76 mmHg  Pulse 69  Temp(Src) 97.4 F (36.3 C)  Wt 73.483 kg (162 lb)  LMP 01/16/2014 (Approximate) FHT:  145 BPM  Uterine Size: size equals dates  Presentation: cephalic   Cervix: 2cm, 50%, -3, posterior & soft.  Assessment:  Doing well  Pregnancy @ 7533w6d weeks   Plan:     labs reviewed, problem list updated GBS sent Pediatrician: discussed. Infant feeding: plans to breastfeed. Maternity leave: none. Cigarette smoking: never smoked. Desires epidural for labor pain.   Orders Placed This Encounter  Procedures  . Strep B DNA probe  . POCT urinalysis dipstick   No orders of the defined types were placed in this encounter.   Follow up in 1 Week.

## 2014-10-02 LAB — STREP B DNA PROBE: STREP GROUP B AG: DETECTED

## 2014-10-13 ENCOUNTER — Ambulatory Visit (INDEPENDENT_AMBULATORY_CARE_PROVIDER_SITE_OTHER): Payer: Medicaid Other | Admitting: Obstetrics

## 2014-10-13 ENCOUNTER — Encounter: Payer: Self-pay | Admitting: Obstetrics

## 2014-10-13 VITALS — BP 111/71 | HR 97 | Temp 98.6°F | Wt 162.0 lb

## 2014-10-13 DIAGNOSIS — Z3483 Encounter for supervision of other normal pregnancy, third trimester: Secondary | ICD-10-CM

## 2014-10-13 LAB — POCT URINALYSIS DIPSTICK
Bilirubin, UA: NEGATIVE
GLUCOSE UA: NEGATIVE
Ketones, UA: NEGATIVE
LEUKOCYTES UA: NEGATIVE
Nitrite, UA: NEGATIVE
Protein, UA: NEGATIVE
RBC UA: NEGATIVE
Spec Grav, UA: 1.015
Urobilinogen, UA: NEGATIVE
pH, UA: 6

## 2014-10-13 NOTE — Progress Notes (Signed)
Subjective:    Judith Mcintosh is a 29 y.o. female being seen today for her obstetrical visit. She is at 5070w4d gestation. Patient reports no complaints. Fetal movement: normal.  Problem List Items Addressed This Visit    None    Visit Diagnoses    Encounter for supervision of other normal pregnancy in third trimester    -  Primary    Relevant Orders    POCT urinalysis dipstick      Patient Active Problem List   Diagnosis Date Noted  . [redacted] weeks gestation of pregnancy   . Evaluate anatomy not seen on prior sonogram   . Other complicated headache syndrome 05/05/2014  . Unsure of LMP (last menstrual period) as reason for ultrasound scan   . [redacted] weeks gestation of pregnancy   . IRRITABLE BOWEL SYNDROME 05/21/2008  . UTI 05/21/2008    Objective:    BP 111/71 mmHg  Pulse 97  Temp(Src) 98.6 F (37 C)  Wt 162 lb (73.483 kg)  LMP 01/16/2014 (Approximate) FHT: 150 BPM  Uterine Size: size equals dates  Presentations: cephalic  Pelvic Exam: Deferred    Assessment:    Pregnancy @ 6770w4d weeks   Plan:   Plans for delivery: Vaginal anticipated; labs reviewed; problem list updated Counseling: Consent signed. Infant feeding: plans to breastfeed. Cigarette smoking: never smoked. L&D discussion: symptoms of labor, discussed when to call, discussed what number to call, anesthetic/analgesic options reviewed and delivering clinician:  plans Physician. Postpartum supports and preparation: circumcision discussed and contraception plans discussed.  Follow up in 1 Week.

## 2014-10-18 ENCOUNTER — Encounter (HOSPITAL_COMMUNITY): Payer: Self-pay

## 2014-10-18 ENCOUNTER — Inpatient Hospital Stay (HOSPITAL_COMMUNITY)
Admission: AD | Admit: 2014-10-18 | Discharge: 2014-10-18 | Disposition: A | Discharge status: Home / Self Care | Payer: Medicaid Other | Source: Ambulatory Visit | Attending: Obstetrics | Admitting: Obstetrics

## 2014-10-18 DIAGNOSIS — O9989 Other specified diseases and conditions complicating pregnancy, childbirth and the puerperium: Secondary | ICD-10-CM | POA: Diagnosis not present

## 2014-10-18 DIAGNOSIS — Z3A39 39 weeks gestation of pregnancy: Secondary | ICD-10-CM | POA: Diagnosis not present

## 2014-10-18 LAB — AMNISURE RUPTURE OF MEMBRANE (ROM) NOT AT ARMC: Amnisure ROM: NEGATIVE

## 2014-10-18 NOTE — MAU Provider Note (Signed)
Ms. Judith Mcintosh is a 29 y.o. [phone] at 7932w2d  who presents to MAU today complaining of contractions and LOF. The patient states contractions q 5 minutes tonight. She states LOF last night and slightly more today. She denies vaginal bleeding or complications with the pregnancy. She reports good fetal movement.   BP 117/71 mmHg  Pulse 87  Temp(Src) 98.4 F (36.9 C) (Oral)  Resp 18  LMP 01/16/2014 (Approximate) GENERAL: Well-developed, well-nourished female in no acute distress.  HEAD: Normocephalic, atraumatic.  CHEST: Normal effort of breathing, regular heart rate ABDOMEN: Soft, nontender, nondistended.  PELVIC: Normal external female genitalia. Vagina is pink and rugated.  Moderate amount of thin, white discharge.  Negative pooling. Gravid uterus.   EXTREMITIES: No cyanosis, clubbing, or edema Dilation: 3 Effacement (%): 50 Presentation: Vertex Exam by:: J.Wenzel PA  Fern - Negative  Fetal Monitoring:  Baseline: 140 bpm, moderate variability, + accelerations, no decelerations Contractions: moderate UI  Results for orders placed or performed during the hospital encounter of 10/18/14 (from the past 24 hour(s))  Amnisure rupture of membrane (rom)     Status: None   Collection Time: 10/18/14 10:45 PM  Result Value Ref Range   Amnisure ROM NEGATIVE     MDM Fern slide difficult to read due to amount of discharge. Will have RN collect Amnisure to confirm  A: Membranes intact  P: Report given to RN to contact MD on call for further instructions  Marny LowensteinJulie N Wenzel, PA-C 10/18/2014 11:23 PM

## 2014-10-18 NOTE — MAU Note (Signed)
Contractions since last night but stronger now. Denies LOF or bleeding. Thinks was 2cm last check. Denies any problems with pregnancy

## 2014-10-20 ENCOUNTER — Ambulatory Visit (INDEPENDENT_AMBULATORY_CARE_PROVIDER_SITE_OTHER): Payer: Medicaid Other | Admitting: Obstetrics

## 2014-10-20 VITALS — BP 114/82 | HR 78 | Temp 98.6°F | Wt 162.0 lb

## 2014-10-20 DIAGNOSIS — Z3483 Encounter for supervision of other normal pregnancy, third trimester: Secondary | ICD-10-CM

## 2014-10-20 LAB — POCT URINALYSIS DIPSTICK
Bilirubin, UA: NEGATIVE
Blood, UA: NEGATIVE
GLUCOSE UA: NEGATIVE
Ketones, UA: NEGATIVE
Nitrite, UA: NEGATIVE
Protein, UA: NEGATIVE
SPEC GRAV UA: 1.015
Urobilinogen, UA: NEGATIVE
pH, UA: 5

## 2014-10-21 ENCOUNTER — Inpatient Hospital Stay (HOSPITAL_COMMUNITY)
Admission: AD | Admit: 2014-10-21 | Discharge: 2014-10-21 | Disposition: A | Discharge status: Home / Self Care | Payer: Medicaid Other | Source: Ambulatory Visit | Attending: Obstetrics | Admitting: Obstetrics

## 2014-10-21 DIAGNOSIS — Z3A39 39 weeks gestation of pregnancy: Secondary | ICD-10-CM | POA: Diagnosis not present

## 2014-10-21 NOTE — MAU Note (Signed)
Pt to be discharged to home with labor d/c instructions.

## 2014-10-21 NOTE — Discharge Instructions (Signed)
Fetal Movement Counts °Patient Name: __________________________________________________ Patient Due Date: ____________________ °Performing a fetal movement count is highly recommended in high-risk pregnancies, but it is good for every pregnant woman to do. Your health care provider may ask you to start counting fetal movements at 28 weeks of the pregnancy. Fetal movements often increase: °· After eating a full meal. °· After physical activity. °· After eating or drinking something sweet or cold. °· At rest. °Pay attention to when you feel the baby is most active. This will help you notice a pattern of your baby's sleep and wake cycles and what factors contribute to an increase in fetal movement. It is important to perform a fetal movement count at the same time each day when your baby is normally most active.  °HOW TO COUNT FETAL MOVEMENTS °1. Find a quiet and comfortable area to sit or lie down on your left side. Lying on your left side provides the best blood and oxygen circulation to your baby. °2. Write down the day and time on a sheet of paper or in a journal. °3. Start counting kicks, flutters, swishes, rolls, or jabs in a 2-hour period. You should feel at least 10 movements within 2 hours. °4. If you do not feel 10 movements in 2 hours, wait 2-3 hours and count again. Look for a change in the pattern or not enough counts in 2 hours. °SEEK MEDICAL CARE IF: °· You feel less than 10 counts in 2 hours, tried twice. °· There is no movement in over an hour. °· The pattern is changing or taking longer each day to reach 10 counts in 2 hours. °· You feel the baby is not moving as he or she usually does. °Date: ____________ Movements: ____________ Start time: ____________ Finish time: ____________  °Date: ____________ Movements: ____________ Start time: ____________ Finish time: ____________ °Date: ____________ Movements: ____________ Start time: ____________ Finish time: ____________ °Date: ____________ Movements:  ____________ Start time: ____________ Finish time: ____________ °Date: ____________ Movements: ____________ Start time: ____________ Finish time: ____________ °Date: ____________ Movements: ____________ Start time: ____________ Finish time: ____________ °Date: ____________ Movements: ____________ Start time: ____________ Finish time: ____________ °Date: ____________ Movements: ____________ Start time: ____________ Finish time: ____________  °Date: ____________ Movements: ____________ Start time: ____________ Finish time: ____________ °Date: ____________ Movements: ____________ Start time: ____________ Finish time: ____________ °Date: ____________ Movements: ____________ Start time: ____________ Finish time: ____________ °Date: ____________ Movements: ____________ Start time: ____________ Finish time: ____________ °Date: ____________ Movements: ____________ Start time: ____________ Finish time: ____________ °Date: ____________ Movements: ____________ Start time: ____________ Finish time: ____________ °Date: ____________ Movements: ____________ Start time: ____________ Finish time: ____________  °Date: ____________ Movements: ____________ Start time: ____________ Finish time: ____________ °Date: ____________ Movements: ____________ Start time: ____________ Finish time: ____________ °Date: ____________ Movements: ____________ Start time: ____________ Finish time: ____________ °Date: ____________ Movements: ____________ Start time: ____________ Finish time: ____________ °Date: ____________ Movements: ____________ Start time: ____________ Finish time: ____________ °Date: ____________ Movements: ____________ Start time: ____________ Finish time: ____________ °Date: ____________ Movements: ____________ Start time: ____________ Finish time: ____________  °Date: ____________ Movements: ____________ Start time: ____________ Finish time: ____________ °Date: ____________ Movements: ____________ Start time: ____________ Finish  time: ____________ °Date: ____________ Movements: ____________ Start time: ____________ Finish time: ____________ °Date: ____________ Movements: ____________ Start time: ____________ Finish time: ____________ °Date: ____________ Movements: ____________ Start time: ____________ Finish time: ____________ °Date: ____________ Movements: ____________ Start time: ____________ Finish time: ____________ °Date: ____________ Movements: ____________ Start time: ____________ Finish time: ____________  °Date: ____________ Movements: ____________ Start time: ____________ Finish   time: ____________ °Date: ____________ Movements: ____________ Start time: ____________ Finish time: ____________ °Date: ____________ Movements: ____________ Start time: ____________ Finish time: ____________ °Date: ____________ Movements: ____________ Start time: ____________ Finish time: ____________ °Date: ____________ Movements: ____________ Start time: ____________ Finish time: ____________ °Date: ____________ Movements: ____________ Start time: ____________ Finish time: ____________ °Date: ____________ Movements: ____________ Start time: ____________ Finish time: ____________  °Date: ____________ Movements: ____________ Start time: ____________ Finish time: ____________ °Date: ____________ Movements: ____________ Start time: ____________ Finish time: ____________ °Date: ____________ Movements: ____________ Start time: ____________ Finish time: ____________ °Date: ____________ Movements: ____________ Start time: ____________ Finish time: ____________ °Date: ____________ Movements: ____________ Start time: ____________ Finish time: ____________ °Date: ____________ Movements: ____________ Start time: ____________ Finish time: ____________ °Date: ____________ Movements: ____________ Start time: ____________ Finish time: ____________  °Date: ____________ Movements: ____________ Start time: ____________ Finish time: ____________ °Date: ____________  Movements: ____________ Start time: ____________ Finish time: ____________ °Date: ____________ Movements: ____________ Start time: ____________ Finish time: ____________ °Date: ____________ Movements: ____________ Start time: ____________ Finish time: ____________ °Date: ____________ Movements: ____________ Start time: ____________ Finish time: ____________ °Date: ____________ Movements: ____________ Start time: ____________ Finish time: ____________ °Date: ____________ Movements: ____________ Start time: ____________ Finish time: ____________  °Date: ____________ Movements: ____________ Start time: ____________ Finish time: ____________ °Date: ____________ Movements: ____________ Start time: ____________ Finish time: ____________ °Date: ____________ Movements: ____________ Start time: ____________ Finish time: ____________ °Date: ____________ Movements: ____________ Start time: ____________ Finish time: ____________ °Date: ____________ Movements: ____________ Start time: ____________ Finish time: ____________ °Date: ____________ Movements: ____________ Start time: ____________ Finish time: ____________ °Document Released: 07/06/2006 Document Revised: 10/21/2013 Document Reviewed: 04/02/2012 °[employer]® Patient Information ©2015 [employer]. This information is not intended to replace advice given to you by your health care provider. Make sure you discuss any questions you have with your health care provider. °Braxton Hicks Contractions °Contractions of the uterus can occur throughout pregnancy. Contractions are not always a sign that you are in labor.  °WHAT ARE BRAXTON HICKS CONTRACTIONS?  °Contractions that occur before labor are called Braxton Hicks contractions, or false labor. Toward the end of pregnancy (32-34 weeks), these contractions can develop more often and may become more forceful. This is not true labor because these contractions do not result in opening (dilatation) and thinning of the cervix. They  are sometimes difficult to tell apart from true labor because these contractions can be forceful and people have different pain tolerances. You should not feel embarrassed if you go to the hospital with false labor. Sometimes, the only way to tell if you are in true labor is for your health care provider to look for changes in the cervix. °If there are no prenatal problems or other health problems associated with the pregnancy, it is completely safe to be sent home with false labor and await the onset of true labor. °HOW CAN YOU TELL THE DIFFERENCE BETWEEN TRUE AND FALSE LABOR? °False Labor °· The contractions of false labor are usually shorter and not as hard as those of true labor.   °· The contractions are usually irregular.   °· The contractions are often felt in the front of the lower abdomen and in the groin.   °· The contractions may go away when you walk around or change positions while lying down.   °· The contractions get weaker and are shorter lasting as time goes on.   °· The contractions do not usually become progressively stronger, regular, and closer together as with true labor.   °True Labor °5. Contractions in true labor last 30-70 seconds, become   very regular, usually become more intense, and increase in frequency.   °6. The contractions do not go away with walking.   °7. The discomfort is usually felt in the top of the uterus and spreads to the lower abdomen and low back.   °8. True labor can be determined by your health care provider with an exam. This will show that the cervix is dilating and getting thinner.   °WHAT TO REMEMBER °· Keep up with your usual exercises and follow other instructions given by your health care provider.   °· Take medicines as directed by your health care provider.   °· Keep your regular prenatal appointments.   °· Eat and drink lightly if you think you are going into labor.   °· If Braxton Hicks contractions are making you uncomfortable:   °· Change your position from  lying down or resting to walking, or from walking to resting.   °· Sit and rest in a tub of warm water.   °· Drink 2-3 glasses of water. Dehydration may cause these contractions.   °· Do slow and deep breathing several times an hour.   °WHEN SHOULD I SEEK IMMEDIATE MEDICAL CARE? °Seek immediate medical care if: °· Your contractions become stronger, more regular, and closer together.   °· You have fluid leaking or gushing from your vagina.   °· You have a fever.   °· You pass blood-tinged mucus.   °· You have vaginal bleeding.   °· You have continuous abdominal pain.   °· You have low back pain that you never had before.   °· You feel your baby's head pushing down and causing pelvic pressure.   °· Your baby is not moving as much as it used to.   °Document Released: 06/06/2005 Document Revised: 06/11/2013 Document Reviewed: 03/18/2013 °ExitCare® Patient Information ©2015 [employer]. This information is not intended to replace advice given to you by your health care provider. Make sure you discuss any questions you have with your health care provider. ° °

## 2014-10-21 NOTE — MAU Note (Signed)
Harlon FlorJ Wenzel PA in to perform speculum exam and ferning.

## 2014-10-21 NOTE — MAU Provider Note (Signed)
Ms. Vista Lawmanatasha N Marini is a 29 y.o. [phone] at 466w5d  who presents to MAU today complaining of contractions and LOF. The patient states contractions q 5 minutes. She states LOF since yesterday. Patient was seen in MAU on Saturday for LOF and had negative fern and amnisure. She denies vaginal bleeding. She reports good fetal movement.    BP 120/75 mmHg  Pulse 88  Temp(Src) 98.1 F (36.7 C) (Oral)  Resp 18  Ht 4\' 8"  (1.422 m)  Wt 162 lb (73.483 kg)  BMI 36.34 kg/m2  LMP 01/16/2014 (Approximate) GENERAL: Well-developed, well-nourished female in no acute distress.  HEAD: Normocephalic, atraumatic.  CHEST: Normal effort of breathing, regular heart rate ABDOMEN: Soft, nontender, nondistended.  PELVIC: Normal external female genitalia. Vagina is pink and rugated.  Normal discharge.  Negative pooling. Gravid uterus.   EXTREMITIES: No cyanosis, clubbing, or edema  Fern - negative  Fetal Monitoring:  Baseline: 140 bpm, moderate variability, + accelerations, no decelerations Contractions: occasional, irregular  A: Membranes intact  P: Report given to RN to contact MD on call for further instructions  Marny LowensteinJulie N Wenzel, PA-C 10/21/2014 9:12 PM

## 2014-10-21 NOTE — MAU Note (Signed)
Pt states that she began to leak some discharge and fluid yesterday morning. Pt states that she was having some leakage on the week-end and went to the MD's office and then MAU and ferning was negative.

## 2014-10-23 ENCOUNTER — Encounter: Payer: Self-pay | Admitting: Obstetrics

## 2014-10-23 NOTE — Progress Notes (Signed)
Subjective:    Judith Mcintosh is a 29 y.o. female being seen today for her obstetrical visit. She is at 702w0d gestation. Patient reports no complaints. Fetal movement: normal.  Problem List Items Addressed This Visit    None    Visit Diagnoses    Encounter for supervision of other normal pregnancy in third trimester    -  Primary    Relevant Orders    POCT urinalysis dipstick (Completed)      Patient Active Problem List   Diagnosis Date Noted  . [redacted] weeks gestation of pregnancy   . Evaluate anatomy not seen on prior sonogram   . Other complicated headache syndrome 05/05/2014  . Unsure of LMP (last menstrual period) as reason for ultrasound scan   . [redacted] weeks gestation of pregnancy   . IRRITABLE BOWEL SYNDROME 05/21/2008  . UTI 05/21/2008    Objective:    BP 114/82 mmHg  Pulse 78  Temp(Src) 98.6 F (37 C)  Wt 162 lb (73.483 kg)  LMP 01/16/2014 (Approximate) FHT:  150 BPM  Uterine Size: size equals dates  Presentation: cephalic  Pelvic Exam:              Dilation: 2cm       Effacement: 50%   Station:  -3     Consistency: medium            Position: posterior     Assessment:    Pregnancy @ 482w0d  weeks   Plan:    Postdates management: discussed fetal surveillance and induction, discussed fetal movement, NST reactive. Induction: scheduled for ~41 weeks, written information given.  Follow up in 1 week.

## 2014-10-27 ENCOUNTER — Ambulatory Visit (INDEPENDENT_AMBULATORY_CARE_PROVIDER_SITE_OTHER): Payer: Medicaid Other | Admitting: Obstetrics

## 2014-10-27 ENCOUNTER — Inpatient Hospital Stay (HOSPITAL_COMMUNITY): Payer: Medicaid Other

## 2014-10-27 ENCOUNTER — Encounter: Payer: Medicaid Other | Admitting: Obstetrics

## 2014-10-27 ENCOUNTER — Encounter (HOSPITAL_COMMUNITY): Payer: Self-pay | Admitting: *Deleted

## 2014-10-27 ENCOUNTER — Inpatient Hospital Stay (HOSPITAL_COMMUNITY)
Admission: AD | Admit: 2014-10-27 | Discharge: 2014-10-30 | DRG: 775 | Disposition: A | Discharge status: Home / Self Care | Payer: Medicaid Other | Source: Ambulatory Visit | Attending: Obstetrics | Admitting: Obstetrics

## 2014-10-27 ENCOUNTER — Encounter: Payer: Self-pay | Admitting: Obstetrics

## 2014-10-27 VITALS — BP 130/84 | HR 65 | Temp 98.6°F | Wt 167.0 lb

## 2014-10-27 DIAGNOSIS — Z3483 Encounter for supervision of other normal pregnancy, third trimester: Secondary | ICD-10-CM

## 2014-10-27 DIAGNOSIS — O480 Post-term pregnancy: Secondary | ICD-10-CM | POA: Diagnosis present

## 2014-10-27 DIAGNOSIS — Z3A40 40 weeks gestation of pregnancy: Secondary | ICD-10-CM | POA: Diagnosis present

## 2014-10-27 DIAGNOSIS — O368390 Maternal care for abnormalities of the fetal heart rate or rhythm, unspecified trimester, not applicable or unspecified: Secondary | ICD-10-CM | POA: Diagnosis present

## 2014-10-27 HISTORY — DX: Irritable bowel syndrome, unspecified: K58.9

## 2014-10-27 LAB — POCT URINALYSIS DIPSTICK
BILIRUBIN UA: NEGATIVE
Blood, UA: NEGATIVE
GLUCOSE UA: NEGATIVE
KETONES UA: NEGATIVE
LEUKOCYTES UA: NEGATIVE
Nitrite, UA: NEGATIVE
PH UA: 6
Protein, UA: NEGATIVE
Spec Grav, UA: 1.015
Urobilinogen, UA: NEGATIVE

## 2014-10-27 LAB — TYPE AND SCREEN
ABO/RH(D): O POS
ANTIBODY SCREEN: NEGATIVE

## 2014-10-27 LAB — CBC
HCT: 34.4 % — ABNORMAL LOW (ref 36.0–46.0)
HEMOGLOBIN: 11.5 g/dL — AB (ref 12.0–15.0)
MCH: 29.9 pg (ref 26.0–34.0)
MCHC: 33.4 g/dL (ref 30.0–36.0)
MCV: 89.4 fL (ref 78.0–100.0)
Platelets: 315 10*3/uL (ref 150–400)
RBC: 3.85 MIL/uL — ABNORMAL LOW (ref 3.87–5.11)
RDW: 13.7 % (ref 11.5–15.5)
WBC: 8.2 10*3/uL (ref 4.0–10.5)

## 2014-10-27 LAB — ABO/RH: ABO/RH(D): O POS

## 2014-10-27 MED ORDER — LIDOCAINE HCL (PF) 1 % IJ SOLN
30.0000 mL | INTRAMUSCULAR | Status: DC | PRN
  Filled 2014-10-27: qty 30

## 2014-10-27 MED ORDER — LACTATED RINGERS IV SOLN
INTRAVENOUS | Status: DC
  Administered 2014-10-27 – 2014-10-28 (×2): via INTRAVENOUS

## 2014-10-27 MED ORDER — ONDANSETRON HCL 4 MG/2ML IJ SOLN: 4.0000 mg | Freq: Four times a day (QID) | INTRAMUSCULAR | Status: DC | PRN

## 2014-10-27 MED ORDER — OXYTOCIN BOLUS FROM INFUSION: 500.0000 mL | INTRAVENOUS | Status: DC

## 2014-10-27 MED ORDER — OXYCODONE-ACETAMINOPHEN 5-325 MG PO TABS: 1.0000 | ORAL_TABLET | ORAL | Status: DC | PRN

## 2014-10-27 MED ORDER — ACETAMINOPHEN 325 MG PO TABS: 650.0000 mg | ORAL_TABLET | ORAL | Status: DC | PRN

## 2014-10-27 MED ORDER — LACTATED RINGERS IV SOLN
500.0000 mL | INTRAVENOUS | Status: DC | PRN
  Administered 2014-10-27 – 2014-10-28 (×2): 500 mL via INTRAVENOUS

## 2014-10-27 MED ORDER — TERBUTALINE SULFATE 1 MG/ML IJ SOLN
0.2500 mg | Freq: Once | INTRAMUSCULAR | Status: AC | PRN
  Administered 2014-10-27: 0.25 mg via SUBCUTANEOUS
  Filled 2014-10-27: qty 1

## 2014-10-27 MED ORDER — OXYCODONE-ACETAMINOPHEN 5-325 MG PO TABS: 2.0000 | ORAL_TABLET | ORAL | Status: DC | PRN

## 2014-10-27 MED ORDER — MISOPROSTOL 25 MCG QUARTER TABLET
25.0000 ug | ORAL_TABLET | Freq: Once | ORAL | Status: AC
  Administered 2014-10-27: 25 ug via VAGINAL
  Filled 2014-10-27: qty 0.25

## 2014-10-27 MED ORDER — PENICILLIN G POTASSIUM 5000000 UNITS IJ SOLR
5.0000 10*6.[IU] | Freq: Once | INTRAVENOUS | Status: AC
  Administered 2014-10-27: 5 10*6.[IU] via INTRAVENOUS
  Filled 2014-10-27: qty 5

## 2014-10-27 MED ORDER — CITRIC ACID-SODIUM CITRATE 334-500 MG/5ML PO SOLN: 30.0000 mL | ORAL | Status: DC | PRN

## 2014-10-27 MED ORDER — FLEET ENEMA 7-19 GM/118ML RE ENEM: 1.0000 | ENEMA | RECTAL | Status: DC | PRN

## 2014-10-27 MED ORDER — OXYTOCIN 40 UNITS IN LACTATED RINGERS INFUSION - SIMPLE MED: 62.5000 mL/h | INTRAVENOUS | Status: DC

## 2014-10-27 MED ORDER — PENICILLIN G POTASSIUM 5000000 UNITS IJ SOLR
2.5000 10*6.[IU] | INTRAVENOUS | Status: DC
  Administered 2014-10-27 – 2014-10-28 (×5): 2.5 10*6.[IU] via INTRAVENOUS
  Filled 2014-10-27 (×9): qty 2.5

## 2014-10-27 NOTE — MAU Note (Signed)
No problems, for scheduled induction on Wed for post dates.  Dr Clearance CootsHarper just wanted some monitoring and an US to check the fluid level to make sure it was ok to wait

## 2014-10-27 NOTE — H&P (Signed)
Judith Mcintosh is a 29 y.o. female presented for prenatal visit and had variable decels on NST that was other wise reactive.  Patient therefore admitted for IOL for postdates and variable FHR decels. Maternal Medical History:  Fetal activity: Perceived fetal activity is normal.   Last perceived fetal movement was within the past hour.    Prenatal complications: no prenatal complications Prenatal Complications - Diabetes: none.    OB History    Gravida Para Term Preterm AB TAB SAB Ectopic Multiple Living   4 3 2 1   0 0 0 0 3     Past Medical History  Diagnosis Date  . Abnormal Pap smear 2005    LEEP  . Headache(784.0) Migraine   Past Surgical History  Procedure Laterality Date  . Leep  2005  . No past surgeries     Family History: family history includes Hypertension in her mother. Social History:  reports that she has never smoked. She has never used smokeless tobacco. She reports that she does not drink alcohol or use illicit drugs.   Prenatal Transfer Tool  Maternal Diabetes: No Genetic Screening: Normal Maternal Ultrasounds/Referrals: Normal Fetal Ultrasounds or other Referrals:  None Maternal Substance Abuse:  No Significant Maternal Medications:  None Significant Maternal Lab Results:  None Other Comments:  None  Review of Systems  All other systems reviewed and are negative.   Dilation: 4 Effacement (%): 60 Station: -3 Exam by:: [place] Blood pressure 130/75, pulse 55, temperature 99 F (37.2 C), temperature source Oral, resp. rate 16, height 4\' 8"  (1.422 m), weight 167 lb (75.751 kg), last menstrual period 01/16/2014, unknown if currently breastfeeding. Maternal Exam:  Abdomen: Patient reports no abdominal tenderness. Fetal presentation: vertex  Pelvis: adequate for delivery.   Cervix: Cervix evaluated by digital exam.     Physical Exam  Nursing note and vitals reviewed. Constitutional: She is oriented to person, place, and time. She appears  well-developed and well-nourished.  HENT:  Head: Normocephalic and atraumatic.  Eyes: Conjunctivae are normal. Pupils are equal, round, and reactive to light.  Neck: Normal range of motion. Neck supple.  Cardiovascular: Normal rate and regular rhythm.   Respiratory: Effort normal.  GI: Soft.  Genitourinary: Vagina normal and uterus normal.  Musculoskeletal: Normal range of motion.  Neurological: She is alert and oriented to person, place, and time.  Skin: Skin is warm and dry.  Psychiatric: She has a normal mood and affect. Her behavior is normal. Judgment and thought content normal.    Prenatal labs: ABO, Rh: O/POS/-- (11/02 1137) Antibody: NEG (11/02 1137) Rubella: 2.97 (11/02 1137) RPR: NON REAC (02/02 1336)  HBsAg: NEGATIVE (11/02 1137)  HIV: NONREACTIVE (02/02 1336)  GBS: Detected (04/13 1326)   Assessment/Plan: 40.4 weeks.  Variable FHR decels.  Admit.  IOL.   HARPER,CHARLES A 10/27/2014, 4:50 PM

## 2014-10-27 NOTE — Progress Notes (Signed)
Dr. Gaynell FaceMarshall notified of FHR change to rate of 170 bpm with variable decels. Notified of nursing interventions including bolus of IV fluid and change of maternal position. Order received to initiate O2 at 10L/min via mask.

## 2014-10-27 NOTE — Progress Notes (Signed)
Subjective:    Judith Mcintosh is a 29 y.o. female being seen today for her obstetrical visit. She is at 2749w4d gestation. Patient reports no complaints. Fetal movement: normal.  Problem List Items Addressed This Visit    None    Visit Diagnoses    Encounter for supervision of other normal pregnancy in third trimester    -  Primary    Relevant Orders    POCT urinalysis dipstick (Completed)    Fetal nonstress test      Patient Active Problem List   Diagnosis Date Noted  . [redacted] weeks gestation of pregnancy   . Evaluate anatomy not seen on prior sonogram   . Other complicated headache syndrome 05/05/2014  . Unsure of LMP (last menstrual period) as reason for ultrasound scan   . [redacted] weeks gestation of pregnancy   . IRRITABLE BOWEL SYNDROME 05/21/2008  . UTI 05/21/2008    Objective:    BP 130/84 mmHg  Pulse 65  Temp(Src) 98.6 F (37 C)  Wt 167 lb (75.751 kg)  LMP 01/16/2014 (Approximate) FHT:  150 BPM  Uterine Size: size equals dates  Presentation: cephalic  Pelvic Exam: Deferred    Assessment:    Pregnancy @ 2949w4d  weeks   Plan:    Postdates management: discussed fetal surveillance and induction, discussed fetal movement, NST reactive, biophysical profile ordered. Induction: scheduled for 10-29-14, written information given.

## 2014-10-27 NOTE — MAU Provider Note (Signed)
Chief Complaint:  No chief complaint on file.   First Provider Initiated Contact with Patient 10/27/14 1434     HPI: Vista Lawmanatasha N Zukowski is a 29 y.o. [id number] at 4980w4d who was sent to maternity admissions for NST and BPP from Dr. Thomes LollingHarpers office. Pt is post-term w/ IOL scheduled at 41 weeks.    Denies contractions, leakage of fluid or vaginal bleeding. Good fetal movement.   Past Medical History: Past Medical History  Diagnosis Date  . Abnormal Pap smear 2005    LEEP  . Headache(784.0) Migraine    Past obstetric history: OB History  Gravida Para Term Preterm AB SAB TAB Ectopic Multiple Living  4 3 2 1   0 0 0 0 3    # Outcome Date GA Lbr Len/2nd Weight Sex Delivery Anes PTL Lv  4 Current           3 Preterm 04/21/11 9460w3d 11:13 / 00:35 6 lb 5.8 oz (2.886 kg) F Vag-Spont EPI  Y  2 Term      Vag-Spont   Y  1 Term      Vag-Spont   Y      Past Surgical History: Past Surgical History  Procedure Laterality Date  . Leep  2005  . No past surgeries       Family History: Family History  Problem Relation Age of Onset  . Hypertension Mother     Social History: History  Substance Use Topics  . Smoking status: Never Smoker   . Smokeless tobacco: Never Used  . Alcohol Use: No    Allergies: No Known Allergies  Meds:  Prescriptions prior to admission  Medication Sig Dispense Refill Last Dose  . butalbital-acetaminophen-caffeine (FIORICET) 50-325-40 MG per tablet Take 2 tablets by mouth every 6 (six) hours as needed for headache. 40 tablet 2 Past Month at Unknown time  . omeprazole (PRILOSEC) 20 MG capsule Take 1 capsule (20 mg total) by mouth 2 (two) times daily before a meal. (Patient taking differently: Take 20 mg by mouth daily as needed (For heartburn.). ) 60 capsule 5 Past Week at Unknown time  . Prenatal Vit-Fe Fumarate-FA (VITAFOL-OB) TABS Take 1 tablet by mouth daily. 30 each 11 10/20/2014 at Unknown time    ROS:  Review of Systems  Constitutional: Negative for fever and  chills.  Gastrointestinal: Negative for abdominal pain.  Genitourinary:       Neg for VB or LOF    Physical Exam  Blood pressure 120/83, pulse 72, temperature 98.4 F (36.9 C), temperature source Oral, resp. rate 16, last menstrual period 01/16/2014, unknown if currently breastfeeding. GENERAL: Well-developed, well-nourished female in no acute distress.  HEART: normal rate RESP: normal effort GI: Abd soft, non-tender, gravid appropriate for gestational age. Pos BS x 4 MS: Extremities nontender, no edema, normal ROM NEURO: Alert and oriented x 4.  GU: NEFG, physiologic discharge, no blood, cervix clean. No CVAT Dilation: 4 Effacement (%): 60 Station: -3 Presentation: Vertex Exam by:: [place] Jonae Renshaw, CNM  FHT:  Baseline 155 , moderate variability, accelerations present, 1 variable deceleration and 1 ? Late decel Contractions: irreg, mild   Labs: Results for orders placed or performed in visit on 10/27/14 (from the past 24 hour(s))  POCT urinalysis dipstick     Status: None   Collection Time: 10/27/14 12:32 PM  Result Value Ref Range   Color, UA yellow    Clarity, UA clear    Glucose, UA negative    Bilirubin, UA negative  Ketones, UA negative    Spec Grav, UA 1.015    Blood, UA negative    pH, UA 6.0    Protein, UA negative    Urobilinogen, UA negative    Nitrite, UA negative    Leukocytes, UA Negative     Imaging:  No results found.  MAU Course:  Assessment: 1. Maternal care for fetal decelerations during pregnancy   2. Post-dates pregnancy    [facility] consult w/ Dr. Clearance CootsHarper. Cytotec PCN for pos GBS  [place] Lamiracle Chaidez, CNM 10/27/2014 2:44 PM

## 2014-10-28 ENCOUNTER — Inpatient Hospital Stay (HOSPITAL_COMMUNITY): Payer: Medicaid Other | Admitting: Anesthesiology

## 2014-10-28 ENCOUNTER — Encounter (HOSPITAL_COMMUNITY): Payer: Self-pay | Admitting: Certified Nurse Midwife

## 2014-10-28 DIAGNOSIS — O480 Post-term pregnancy: Secondary | ICD-10-CM | POA: Diagnosis not present

## 2014-10-28 LAB — RPR: RPR Ser Ql: NONREACTIVE

## 2014-10-28 MED ORDER — METHYLERGONOVINE MALEATE 0.2 MG/ML IJ SOLN: 0.2000 mg | INTRAMUSCULAR | Status: DC | PRN

## 2014-10-28 MED ORDER — OXYCODONE-ACETAMINOPHEN 5-325 MG PO TABS
2.0000 | ORAL_TABLET | ORAL | Status: DC | PRN
  Administered 2014-10-28 – 2014-10-29 (×3): 2 via ORAL
  Administered 2014-10-29: 1 via ORAL
  Filled 2014-10-28 (×4): qty 2

## 2014-10-28 MED ORDER — TETANUS-DIPHTH-ACELL PERTUSSIS 5-2.5-18.5 LF-MCG/0.5 IM SUSP: 0.5000 mL | Freq: Once | INTRAMUSCULAR | Status: DC

## 2014-10-28 MED ORDER — METHYLERGONOVINE MALEATE 0.2 MG PO TABS: 0.2000 mg | ORAL_TABLET | ORAL | Status: DC | PRN

## 2014-10-28 MED ORDER — OXYTOCIN 40 UNITS IN LACTATED RINGERS INFUSION - SIMPLE MED
1.0000 m[IU]/min | INTRAVENOUS | Status: DC
  Administered 2014-10-28: 2 m[IU]/min via INTRAVENOUS
  Filled 2014-10-28: qty 1000

## 2014-10-28 MED ORDER — TERBUTALINE SULFATE 1 MG/ML IJ SOLN
0.2500 mg | Freq: Once | INTRAMUSCULAR | Status: DC | PRN
  Filled 2014-10-28: qty 1

## 2014-10-28 MED ORDER — FENTANYL CITRATE (PF) 100 MCG/2ML IJ SOLN
INTRAMUSCULAR | Status: AC
  Filled 2014-10-28: qty 2

## 2014-10-28 MED ORDER — WITCH HAZEL-GLYCERIN EX PADS: 1.0000 "application " | MEDICATED_PAD | CUTANEOUS | Status: DC | PRN

## 2014-10-28 MED ORDER — ONDANSETRON HCL 4 MG PO TABS: 4.0000 mg | ORAL_TABLET | ORAL | Status: DC | PRN

## 2014-10-28 MED ORDER — LIDOCAINE HCL (PF) 1 % IJ SOLN
INTRAMUSCULAR | Status: DC | PRN
  Administered 2014-10-28 (×2): 4 mL

## 2014-10-28 MED ORDER — LANOLIN HYDROUS EX OINT: TOPICAL_OINTMENT | CUTANEOUS | Status: DC | PRN

## 2014-10-28 MED ORDER — SIMETHICONE 80 MG PO CHEW: 80.0000 mg | CHEWABLE_TABLET | ORAL | Status: DC | PRN

## 2014-10-28 MED ORDER — OXYTOCIN 40 UNITS IN LACTATED RINGERS INFUSION - SIMPLE MED: 62.5000 mL/h | INTRAVENOUS | Status: DC | PRN

## 2014-10-28 MED ORDER — ZOLPIDEM TARTRATE 5 MG PO TABS: 5.0000 mg | ORAL_TABLET | Freq: Every evening | ORAL | Status: DC | PRN

## 2014-10-28 MED ORDER — ACETAMINOPHEN 325 MG PO TABS: 650.0000 mg | ORAL_TABLET | ORAL | Status: DC | PRN

## 2014-10-28 MED ORDER — IBUPROFEN 600 MG PO TABS
600.0000 mg | ORAL_TABLET | Freq: Four times a day (QID) | ORAL | Status: DC
  Administered 2014-10-28 – 2014-10-30 (×6): 600 mg via ORAL
  Filled 2014-10-28 (×7): qty 1

## 2014-10-28 MED ORDER — PRENATAL MULTIVITAMIN CH
1.0000 | ORAL_TABLET | Freq: Every day | ORAL | Status: DC
  Administered 2014-10-29: 1 via ORAL
  Filled 2014-10-28: qty 1

## 2014-10-28 MED ORDER — ONDANSETRON HCL 4 MG/2ML IJ SOLN
4.0000 mg | INTRAMUSCULAR | Status: DC | PRN
Start: 2014-10-28 — End: 2014-10-30

## 2014-10-28 MED ORDER — FENTANYL 2.5 MCG/ML BUPIVACAINE 1/10 % EPIDURAL INFUSION (WH - ANES)
14.0000 mL/h | INTRAMUSCULAR | Status: DC | PRN
  Administered 2014-10-28: 11 mL/h via EPIDURAL
  Filled 2014-10-28: qty 125

## 2014-10-28 MED ORDER — PHENYLEPHRINE 40 MCG/ML (10ML) SYRINGE FOR IV PUSH (FOR BLOOD PRESSURE SUPPORT)
80.0000 ug | PREFILLED_SYRINGE | INTRAVENOUS | Status: DC | PRN
  Filled 2014-10-28: qty 2
  Filled 2014-10-28: qty 20

## 2014-10-28 MED ORDER — BENZOCAINE-MENTHOL 20-0.5 % EX AERO
1.0000 "application " | INHALATION_SPRAY | CUTANEOUS | Status: DC | PRN
  Administered 2014-10-29: 1 via TOPICAL
  Filled 2014-10-28: qty 56

## 2014-10-28 MED ORDER — DIPHENHYDRAMINE HCL 50 MG/ML IJ SOLN: 12.5000 mg | INTRAMUSCULAR | Status: DC | PRN

## 2014-10-28 MED ORDER — FENTANYL CITRATE (PF) 100 MCG/2ML IJ SOLN
100.0000 ug | Freq: Once | INTRAMUSCULAR | Status: AC
  Administered 2014-10-28: 100 ug via INTRAVENOUS

## 2014-10-28 MED ORDER — SENNOSIDES-DOCUSATE SODIUM 8.6-50 MG PO TABS
2.0000 | ORAL_TABLET | ORAL | Status: DC
  Administered 2014-10-29 (×2): 2 via ORAL
  Filled 2014-10-28 (×2): qty 2

## 2014-10-28 MED ORDER — EPHEDRINE 5 MG/ML INJ
10.0000 mg | INTRAVENOUS | Status: DC | PRN
  Filled 2014-10-28: qty 2

## 2014-10-28 MED ORDER — DIBUCAINE 1 % RE OINT: 1.0000 "application " | TOPICAL_OINTMENT | RECTAL | Status: DC | PRN

## 2014-10-28 MED ORDER — DIPHENHYDRAMINE HCL 25 MG PO CAPS: 25.0000 mg | ORAL_CAPSULE | Freq: Four times a day (QID) | ORAL | Status: DC | PRN

## 2014-10-28 NOTE — Anesthesia Postprocedure Evaluation (Signed)
  Anesthesia Post-op Note  Patient: Judith Mcintosh  Procedure(s) Performed: * No procedures listed *  Patient Location: Mother/Baby  Anesthesia Type:Epidural  Level of Consciousness: awake, alert  and oriented  Airway and Oxygen Therapy: Patient Spontanous Breathing  Post-op Pain: none  Post-op Assessment: Post-op Vital signs reviewed and Patient's Cardiovascular Status Stable  Post-op Vital Signs: Reviewed and stable  Last Vitals:  Filed Vitals:   10/28/14 1725  BP: 107/63  Pulse: 68  Temp: 36.8 C  Resp: 18    Complications: No apparent anesthesia complications

## 2014-10-28 NOTE — Anesthesia Procedure Notes (Signed)
Epidural Patient location during procedure: OB Start time: 10/28/2014 9:55 AM  Staffing Anesthesiologist: Karie SchwalbeJUDD, Belanna Manring Performed by: anesthesiologist   Preanesthetic Checklist Completed: patient identified, site marked, surgical consent, pre-op evaluation, timeout performed, IV checked, risks and benefits discussed and monitors and equipment checked  Epidural Patient position: sitting Prep: site prepped and draped and DuraPrep Patient monitoring: continuous pulse ox and blood pressure Approach: midline Location: L3-L4 Injection technique: LOR saline  Needle:  Needle type: Tuohy  Needle gauge: 17 G Needle length: 9 cm and 9 Needle insertion depth: 6 cm Catheter type: closed end flexible Catheter size: 19 Gauge Catheter at skin depth: 10 cm Test dose: negative  Assessment Events: blood not aspirated, injection not painful, no injection resistance, negative IV test and no paresthesia  Additional Notes Patient identified. Risks/Benefits/Options discussed with patient including but not limited to bleeding, infection, nerve damage, paralysis, failed block, incomplete pain control, headache, blood pressure changes, nausea, vomiting, reactions to medication both or allergic, itching and postpartum back pain. Confirmed with bedside nurse the patient's most recent platelet count. Confirmed with patient that they are not currently taking any anticoagulation, have any bleeding history or any family history of bleeding disorders. Patient expressed understanding and wished to proceed. All questions were answered. Sterile technique was used throughout the entire procedure. Please see nursing notes for vital signs. Test dose was given through epidural catheter and negative prior to continuing to dose epidural or start infusion. Warning signs of high block given to the patient including shortness of breath, tingling/numbness in hands, complete motor block, or any concerning symptoms with instructions to  call for help. Patient was given instructions on fall risk and not to get out of bed. All questions and concerns addressed with instructions to call with any issues or inadequate analgesia.

## 2014-10-28 NOTE — Anesthesia Preprocedure Evaluation (Signed)
Anesthesia Evaluation  Patient identified by MRN, date of birth, ID band Patient awake    Reviewed: Allergy & Precautions, NPO status , Patient's Chart, lab work & pertinent test results  History of Anesthesia Complications Negative for: history of anesthetic complications  Airway Mallampati: II  TM Distance: >3 FB Neck ROM: Full    Dental no notable dental hx. (+) Dental Advisory Given   Pulmonary neg pulmonary ROS,  breath sounds clear to auscultation  Pulmonary exam normal       Cardiovascular negative cardio ROS Normal cardiovascular examRhythm:Regular Rate:Normal     Neuro/Psych  Headaches, negative psych ROS   GI/Hepatic negative GI ROS, Neg liver ROS,   Endo/Other  negative endocrine ROSobesity  Renal/GU negative Renal ROS  negative genitourinary   Musculoskeletal negative musculoskeletal ROS (+)   Abdominal   Peds negative pediatric ROS (+)  Hematology negative hematology ROS (+)   Anesthesia Other Findings   Reproductive/Obstetrics (+) Pregnancy                             Anesthesia Physical Anesthesia Plan  ASA: II  Anesthesia Plan: Epidural   Post-op Pain Management:    Induction:   Airway Management Planned:   Additional Equipment:   Intra-op Plan:   Post-operative Plan:   Informed Consent: I have reviewed the patients History and Physical, chart, labs and discussed the procedure including the risks, benefits and alternatives for the proposed anesthesia with the patient or authorized representative who has indicated his/her understanding and acceptance.   Dental advisory given  Plan Discussed with:   Anesthesia Plan Comments:         Anesthesia Quick Evaluation

## 2014-10-28 NOTE — Lactation Note (Signed)
This note was copied from the chart of Judith Breean Shaffer. Lactation Consultation Note Initial visit at 7 hours of age.  Mom reports a few good feedings.  Baby held by mom swaddled and sleeping at this time.  Mom has experience with breastfeeding 3 older children.  Mom denies concerns or pain with this baby. [facility] LC resources given and discussed.  Encouraged to feed with early cues on demand.  Early newborn behavior discussed.  Hand expression demonstrated by [facility] with colostrum visible mom may need reinforcement with technique.   Mom to call for assist as needed.    Patient Name: Judith Mcintosh EXBMW'UToday's Date: 10/28/2014 Reason for consult: Initial assessment   Maternal Data Has patient been taught Hand Expression?: Yes Does the patient have breastfeeding experience prior to this delivery?: Yes  Feeding Feeding Type: Breast Fed Length of feed: 25 min  LATCH Score/Interventions                Intervention(s): Breastfeeding basics reviewed     Lactation Tools Discussed/Used     Consult Status Consult Status: Follow-up Date: 10/29/14 Follow-up type: In-patient    Shoptaw, Arvella MerlesJana Lynn 10/28/2014, 10:17 PM

## 2014-10-29 ENCOUNTER — Inpatient Hospital Stay (HOSPITAL_COMMUNITY): Admission: RE | Admit: 2014-10-29 | Payer: Medicaid Other | Source: Ambulatory Visit

## 2014-10-29 LAB — CBC
HCT: 29.5 % — ABNORMAL LOW (ref 36.0–46.0)
Hemoglobin: 9.6 g/dL — ABNORMAL LOW (ref 12.0–15.0)
MCH: 29.4 pg (ref 26.0–34.0)
MCHC: 32.5 g/dL (ref 30.0–36.0)
MCV: 90.5 fL (ref 78.0–100.0)
PLATELETS: 288 10*3/uL (ref 150–400)
RBC: 3.26 MIL/uL — ABNORMAL LOW (ref 3.87–5.11)
RDW: 13.7 % (ref 11.5–15.5)
WBC: 10.9 10*3/uL — ABNORMAL HIGH (ref 4.0–10.5)

## 2014-10-29 NOTE — Progress Notes (Signed)
Post Partum Day 1 Subjective: no complaints  Objective: Blood pressure 98/45, pulse 63, temperature 98.2 F (36.8 C), temperature source Oral, resp. rate 18, height 4\' 8"  (1.422 m), weight 167 lb (75.751 kg), last menstrual period 01/16/2014, SpO2 100 %, unknown if currently breastfeeding.  Physical Exam:  General: alert and no distress Lochia: appropriate Uterine Fundus: firm Incision: none DVT Evaluation: No evidence of DVT seen on physical exam.   Recent Labs  10/27/14 1630 10/29/14 0610  HGB 11.5* 9.6*  HCT 34.4* 29.5*    Assessment/Plan: Doing well. Plan for discharge tomorrow   LOS: 2 days   Julaine Zimny A 10/29/2014, 3:29 PM

## 2014-10-30 ENCOUNTER — Encounter (HOSPITAL_COMMUNITY): Payer: Self-pay | Admitting: *Deleted

## 2014-10-30 MED ORDER — DIBUCAINE 1 % RE OINT: 1.0000 "application " | TOPICAL_OINTMENT | RECTAL | Status: DC | PRN

## 2014-10-30 MED ORDER — OXYCODONE-ACETAMINOPHEN 5-325 MG PO TABS: 2.0000 | ORAL_TABLET | ORAL | Status: DC | PRN

## 2014-10-30 MED ORDER — WITCH HAZEL-GLYCERIN EX PADS: 1.0000 "application " | MEDICATED_PAD | CUTANEOUS | Status: DC | PRN

## 2014-10-30 MED ORDER — LANOLIN HYDROUS EX OINT: 1.0000 "application " | TOPICAL_OINTMENT | CUTANEOUS | Status: DC | PRN

## 2014-10-30 MED ORDER — SENNOSIDES-DOCUSATE SODIUM 8.6-50 MG PO TABS: 2.0000 | ORAL_TABLET | ORAL | Status: DC

## 2014-10-30 MED ORDER — IRON 325 (65 FE) MG PO TABS: 1.0000 | ORAL_TABLET | Freq: Two times a day (BID) | ORAL | Status: DC

## 2014-10-30 NOTE — Progress Notes (Signed)
Post Partum Day #2 Subjective: no complaints, up ad lib, voiding, tolerating PO and + flatus  Objective: Blood pressure 117/79, pulse 64, temperature 98 F (36.7 C), temperature source Oral, resp. rate 18, height 4\' 8"  (1.422 m), weight 75.751 kg (167 lb), last menstrual period 01/16/2014, SpO2 99 %, unknown if currently breastfeeding.  Physical Exam:  General: alert, cooperative and no distress Lochia: appropriate Uterine Fundus: firm Incision: none DVT Evaluation: No evidence of DVT seen on physical exam.   Recent Labs  10/27/14 1630 10/29/14 0610  HGB 11.5* 9.6*  HCT 34.4* 29.5*    Assessment/Plan: Discharge home and Lactation consult   LOS: 3 days   Leoncio Hansen A Tram Wrenn 10/30/2014, 8:15 AM

## 2014-10-30 NOTE — Discharge Summary (Signed)
Obstetric Discharge Summary Reason for Admission: induction of labor Prenatal Procedures: none Intrapartum Procedures: spontaneous vaginal delivery Postpartum Procedures: none Complications-Operative and Postpartum: none HEMOGLOBIN  Date Value Ref Range Status  10/29/2014 9.6* 12.0 - 15.0 g/dL Final   HCT  Date Value Ref Range Status  10/29/2014 29.5* 36.0 - 46.0 % Final    Physical Exam:  General: alert, cooperative and no distress Lochia: appropriate Uterine Fundus: firm Incision: none DVT Evaluation: No evidence of DVT seen on physical exam.  Discharge Diagnoses: Term Pregnancy-delivered  Discharge Information: Date: 10/30/2014 Activity: pelvic rest Diet: routine Medications: PNV, Ibuprofen, Colace, Iron and Percocet Condition: stable Instructions: refer to practice specific booklet Discharge to: home   Newborn Data: Live born female  Birth Weight: 7 lb 12.5 oz (3530 g) APGAR: 9, 9  Home with mother.  Roe Coombsachelle A Denney, CNM 10/30/2014, 8:18 AM

## 2014-11-03 ENCOUNTER — Encounter: Payer: Medicaid Other | Admitting: Obstetrics

## 2014-11-04 ENCOUNTER — Encounter: Payer: Medicaid Other | Admitting: Obstetrics

## 2014-11-04 ENCOUNTER — Encounter: Payer: Medicaid Other | Admitting: Certified Nurse Midwife

## 2014-11-10 ENCOUNTER — Encounter: Payer: Self-pay | Admitting: Obstetrics

## 2014-11-10 ENCOUNTER — Ambulatory Visit (INDEPENDENT_AMBULATORY_CARE_PROVIDER_SITE_OTHER): Payer: Medicaid Other | Admitting: Obstetrics

## 2014-11-10 DIAGNOSIS — Z30011 Encounter for initial prescription of contraceptive pills: Secondary | ICD-10-CM

## 2014-11-10 DIAGNOSIS — D509 Iron deficiency anemia, unspecified: Secondary | ICD-10-CM

## 2014-11-10 MED ORDER — FUSION 65-65-25-30 MG PO CAPS: 1.0000 | ORAL_CAPSULE | Freq: Every day | ORAL | Status: DC

## 2014-11-10 MED ORDER — NORETHINDRONE 0.35 MG PO TABS: 1.0000 | ORAL_TABLET | Freq: Every day | ORAL | Status: DC

## 2014-11-10 NOTE — Progress Notes (Signed)
Subjective:     Judith Mcintosh is a 29 y.o. female who presents for a postpartum visit. She is 2 weeks postpartum following a spontaneous vaginal delivery. I have fully reviewed the prenatal and intrapartum course. The delivery was at 40.5 gestational weeks. Outcome: spontaneous vaginal delivery. Anesthesia: epidural. Postpartum course has been normal. Baby's course has been normal. Baby is feeding by breast. Bleeding thin lochia. Bowel function is normal. Bladder function is normal. Patient is not sexually active. Contraception method is abstinence. Postpartum depression screening: negative.  Tobacco, alcohol and substance abuse history reviewed.  Adult immunizations reviewed including TDAP, rubella and varicella.  The following portions of the patient's history were reviewed and updated as appropriate: allergies, current medications, past family history, past medical history, past social history, past surgical history and problem list.  Review of Systems A comprehensive review of systems was negative.   Objective:    BP 117/78 mmHg  Pulse 100  Temp(Src) 98.3 F (36.8 C)  Wt 146 lb (66.225 kg)  LMP 01/16/2014 (Approximate)   PE:  Deferred    Assessment:    2 weeks postpartum.  Doing well.  Contraceptive management.  Wants OCP's  Plan:    1. Contraception: OCP (estrogen/progesterone) 2.  Micronor Rx 3. Follow up in: 4 weeks or as needed.    Healthy lifestyle practices reviewed

## 2014-11-19 ENCOUNTER — Telehealth: Payer: Self-pay | Admitting: *Deleted

## 2014-11-19 NOTE — Telephone Encounter (Signed)
Patient states she is currently on Micronor because she was breastfeeding. Patient states she is no longer breastfeeding and needs to change her birth control. Patient requesting a prescription to be sent to the pharmacy. Patient states she has not been on a birth control pill before.

## 2014-11-21 NOTE — Telephone Encounter (Signed)
OK for OCP such as Loestrin 24.

## 2014-11-26 ENCOUNTER — Other Ambulatory Visit: Payer: Self-pay | Admitting: *Deleted

## 2014-11-26 DIAGNOSIS — Z30011 Encounter for initial prescription of contraceptive pills: Secondary | ICD-10-CM

## 2014-11-26 MED ORDER — NORETHIN ACE-ETH ESTRAD-FE 1-20 MG-MCG(24) PO TABS: 1.0000 | ORAL_TABLET | Freq: Every day | ORAL | Status: DC

## 2014-11-26 NOTE — Telephone Encounter (Signed)
Patient advised prescription for new birth control pill being sent to the pharmacy. Patient verbalized understanding .

## 2014-12-08 ENCOUNTER — Ambulatory Visit: Payer: Medicaid Other | Admitting: Obstetrics

## 2014-12-10 ENCOUNTER — Ambulatory Visit: Payer: Medicaid Other | Admitting: Obstetrics

## 2015-01-19 ENCOUNTER — Telehealth: Payer: Self-pay | Admitting: *Deleted

## 2015-01-19 NOTE — Telephone Encounter (Signed)
Patient contacted the office stating she needs a refill on her birth control. According to her chart she should have refills at the pharmacy. Patient advised to contact the pharmacy for a refill.

## 2015-02-26 ENCOUNTER — Institutional Professional Consult (permissible substitution): Payer: Self-pay | Admitting: Certified Nurse Midwife

## 2015-02-27 ENCOUNTER — Ambulatory Visit (INDEPENDENT_AMBULATORY_CARE_PROVIDER_SITE_OTHER): Payer: Medicaid Other | Admitting: Certified Nurse Midwife

## 2015-02-27 VITALS — BP 118/78 | HR 100 | Temp 98.2°F | Wt 146.0 lb

## 2015-02-27 DIAGNOSIS — Z3009 Encounter for other general counseling and advice on contraception: Secondary | ICD-10-CM

## 2015-02-27 DIAGNOSIS — G44201 Tension-type headache, unspecified, intractable: Secondary | ICD-10-CM | POA: Diagnosis not present

## 2015-02-27 DIAGNOSIS — Z309 Encounter for contraceptive management, unspecified: Secondary | ICD-10-CM | POA: Diagnosis not present

## 2015-02-27 MED ORDER — MEDROXYPROGESTERONE ACETATE 150 MG/ML IM SUSP: 150.0000 mg | INTRAMUSCULAR | Status: DC

## 2015-02-27 NOTE — Progress Notes (Signed)
Patient ID: Judith Mcintosh, female   DOB: 08/09/1985, 29 y.o.   MRN: [id number]  Subjective:    Judith Mcintosh is a 29 y.o. female who presents for contraception counseling. The patient has no complaints today. The patient is sexually active. Pertinent past medical history: none.  Is currently having daily tension type headaches, not waking up with the HA, comes during the day.  Denies being stressed.  Had headaches prior to pregnancy.  Has never been to neurology for an evaluation.  Periods are regular, lasting 5 days, denies any heavy bleeding, cramping or clots.  Has been on depo injections in the past.   LMP one week ago.    The information documented in the HPI was reviewed and verified.  Menstrual History: OB History    Gravida Para Term Preterm AB TAB SAB Ectopic Multiple Living   0 0 0 0 4        Patient's last menstrual period was 02/16/2015.   Patient Active Problem List   Diagnosis Date Noted  . NSVD (normal spontaneous vaginal delivery) 10/28/2014  . Fetal heart rate decelerations affecting management of mother 10/27/2014  . [redacted] weeks gestation of pregnancy   . Evaluate anatomy not seen on prior sonogram   . Other complicated headache syndrome 05/05/2014  . Unsure of LMP (last menstrual period) as reason for ultrasound scan   . [redacted] weeks gestation of pregnancy   . IRRITABLE BOWEL SYNDROME 05/21/2008  . UTI 05/21/2008   Past Medical History  Diagnosis Date  . Abnormal Pap smear 2005    LEEP  . Headache(784.0) Migraine  . Irritable bowel syndrome (IBS)     Past Surgical History  Procedure Laterality Date  . Leep  2005  . No past surgeries       Current outpatient prescriptions:  .  norethindrone (MICRONOR,CAMILA,ERRIN) 0.35 MG tablet, Take 1 tablet (0.35 mg total) by mouth daily., Disp: 1 Package, Rfl: 11 .  butalbital-acetaminophen-caffeine (FIORICET) 50-325-40 MG per tablet, Take 2 tablets by mouth every 6 (six) hours as needed for headache. (Patient  not taking: Reported on 10/27/2014), Disp: 40 tablet, Rfl: 2 .  dibucaine (NUPERCAINAL) 1 % OINT, Place 1 application rectally as needed for hemorrhoids., Disp: 56.7 g, Rfl: 0 .  Fe Fum-Fe Poly-Vit C-Lactobac (FUSION) 65-65-25-30 MG CAPS, Take 1 capsule by mouth daily before breakfast. (Patient not taking: Reported on 02/27/2015), Disp: 30 capsule, Rfl: 5 .  Ferrous Sulfate (IRON) 325 (65 FE) MG TABS, Take 1 tablet by mouth 2 (two) times daily. (Patient not taking: Reported on 11/10/2014), Disp: 60 each, Rfl: 2 .  lanolin OINT, Apply 1 application topically as needed (for breast care). (Patient not taking: Reported on 11/10/2014), Disp: 40 g, Rfl: 0 .  medroxyPROGESTERone (DEPO-PROVERA) 150 MG/ML injection, Inject 1 mL (150 mg total) into the muscle every 3 (three) months., Disp: 1 mL, Rfl: 0 No Known Allergies  Social History  Substance Use Topics  . Smoking status: Never Smoker   . Smokeless tobacco: Never Used  . Alcohol Use: No    Family History  Problem Relation Age of Onset  . Hypertension Mother        Review of Systems Constitutional: negative for weight loss Genitourinary:negative for abnormal menstrual periods and vaginal discharge   Objective:   BP 118/78 mmHg  Pulse 100  Temp(Src) 98.2 F (36.8 C)  Wt 146 lb (66.225 kg)  LMP 02/16/2015   General:   alert  Skin:  no rash or abnormalities  Lungs:   clear to auscultation bilaterally  Heart:   regular rate and rhythm, S1, S2 normal, no murmur, click, rub or gallop  Breasts:   normal without suspicious masses, skin or nipple changes or axillary nodes  Abdomen:  normal findings: no organomegaly, soft, non-tender and no hernia obese  Pelvis:  deferred   Lab Review Urine pregnancy test Labs reviewed no Radiologic studies reviewed no  50% of 15 min visit spent on counseling and coordination of care.   Assessment:    29 y.o., starting Depo-Provera injections, no contraindications.-Provera injections, no contraindications.   Daily tension type HA  Plan:     All questions answered.  Meds ordered this encounter  Medications  . medroxyPROGESTERone (DEPO-PROVERA) 150 MG/ML injection    Sig: Inject 1 mL (150 mg total) into the muscle every 3 (three) months.    Dispense:  1 mL    Refill:  0   Orders Placed This Encounter  Procedures  . Ambulatory referral to Neurology    Referral Priority:  Routine    Referral Type:  Consultation    Referral Reason:  Specialty Services Required    Requested Specialty:  Neurology    Number of Visits Requested:  1   Need to obtain previous records Follow up as needed.

## 2015-02-28 ENCOUNTER — Encounter: Payer: Self-pay | Admitting: Certified Nurse Midwife

## 2015-03-02 ENCOUNTER — Ambulatory Visit (INDEPENDENT_AMBULATORY_CARE_PROVIDER_SITE_OTHER): Payer: Medicaid Other | Admitting: *Deleted

## 2015-03-02 ENCOUNTER — Ambulatory Visit: Payer: Medicaid Other

## 2015-03-02 VITALS — BP 121/82 | HR 93 | Wt 146.0 lb

## 2015-03-02 DIAGNOSIS — Z3042 Encounter for surveillance of injectable contraceptive: Secondary | ICD-10-CM

## 2015-03-02 MED ORDER — MEDROXYPROGESTERONE ACETATE 150 MG/ML IM SUSP
150.0000 mg | Freq: Once | INTRAMUSCULAR | Status: AC
  Administered 2015-03-02: 150 mg via INTRAMUSCULAR

## 2015-03-02 NOTE — Progress Notes (Signed)
Pt is in office today for start of depo.  Pt has been currently taking OCP for contraception.  Pt states LMP is 02/16/15.  Pt was seen by R Denney,CNM and approved for start of depo.  Pt was made aware of depo policy.  Pt was made aware that she is in need of AEX and advised to schedule with next injection date. Pt was made aware next depo due 05-24-15.  Pt states understanding.  Pt was given injection, tolerated well.  BP 121/82 mmHg  Pulse 93  Wt 146 lb (66.225 kg)  LMP 02/16/2015  Administrations This Visit    medroxyPROGESTERone (DEPO-PROVERA) injection 150 mg    Admin Date Action Dose Route Administered By         03/02/2015 Given 150 mg Intramuscular Lanney Gins, CMA

## 2015-03-19 ENCOUNTER — Institutional Professional Consult (permissible substitution): Payer: Medicaid Other | Admitting: Obstetrics

## 2015-03-27 ENCOUNTER — Ambulatory Visit: Payer: Medicaid Other | Admitting: Certified Nurse Midwife

## 2015-05-21 ENCOUNTER — Other Ambulatory Visit: Payer: Self-pay | Admitting: *Deleted

## 2015-05-21 DIAGNOSIS — Z3009 Encounter for other general counseling and advice on contraception: Secondary | ICD-10-CM

## 2015-05-21 MED ORDER — MEDROXYPROGESTERONE ACETATE 150 MG/ML IM SUSP: 150.0000 mg | INTRAMUSCULAR | Status: DC

## 2015-05-26 ENCOUNTER — Encounter: Payer: Self-pay | Admitting: Certified Nurse Midwife

## 2015-05-26 ENCOUNTER — Ambulatory Visit (INDEPENDENT_AMBULATORY_CARE_PROVIDER_SITE_OTHER): Payer: Medicaid Other | Admitting: Certified Nurse Midwife

## 2015-05-26 VITALS — BP 119/85 | HR 94 | Temp 98.2°F | Ht <= 58 in | Wt 151.6 lb

## 2015-05-26 DIAGNOSIS — Z3042 Encounter for surveillance of injectable contraceptive: Secondary | ICD-10-CM

## 2015-05-26 DIAGNOSIS — Z0000 Encounter for general adult medical examination without abnormal findings: Secondary | ICD-10-CM | POA: Diagnosis not present

## 2015-05-26 DIAGNOSIS — Z01419 Encounter for gynecological examination (general) (routine) without abnormal findings: Secondary | ICD-10-CM

## 2015-05-26 MED ORDER — MEDROXYPROGESTERONE ACETATE 150 MG/ML IM SUSP
150.0000 mg | INTRAMUSCULAR | Status: AC
  Administered 2015-05-26 – 2016-04-15 (×3): 150 mg via INTRAMUSCULAR

## 2015-05-26 NOTE — Progress Notes (Signed)
Patient ID: Judith Lawmanatasha N Ullman, female   DOB: 06/21/1985, 29 y.o.   MRN: [id number]    Subjective:      Judith Mcintosh is a 29 y.o. female here for a routine exam.  Current complaints: none.  Having her first period with Depo injections.  Desires to continue Depo injections.  Sexually active.    Declines STD testing.    Personal health questionnaire:  Is patient Ashkenazi Jewish, have a family history of breast and/or ovarian cancer: no Is there a family history of uterine cancer diagnosed at age < 7750, gastrointestinal cancer, urinary tract cancer, family member who is a Personnel officerLynch syndrome-associated carrier: no Is the patient overweight and hypertensive, family history of diabetes, personal history of gestational diabetes, preeclampsia or PCOS: yes Is patient over 8455, have PCOS,  family history of premature CHD under age 29, diabetes, smoke, have hypertension or peripheral artery disease:  yes At any time, has a partner hit, kicked or otherwise hurt or frightened you?: no Over the past 2 weeks, have you felt down, depressed or hopeless?: no Over the past 2 weeks, have you felt little interest or pleasure in doing things?:no   Gynecologic History Patient's last menstrual period was 05/23/2015 (exact date). Contraception: Depo-Provera injections Last Pap: 04/2014. Results were: normal Last mammogram: N/A.   Obstetric History OB History  Gravida Para Term Preterm AB SAB TAB Ectopic Multiple Living  4 4 3 1   0 0 0 0 4    # Outcome Date GA Lbr Len/2nd Weight Sex Delivery Anes PTL Lv  4 Term 10/28/14 [id number] 03:31 / 00:39 7 lb 12.5 oz (3.53 kg) F Vag-Spont EPI  Y     Comments: WNL  3 Preterm 04/21/11 9416w3d 11:13 / 00:35 6 lb 5.8 oz (2.886 kg) F Vag-Spont EPI  Y  2 Term      Vag-Spont   Y  1 Term      Vag-Spont   Y      Past Medical History  Diagnosis Date  . Abnormal Pap smear 2005    LEEP  . Headache(784.0) Migraine  . Irritable bowel syndrome (IBS)     Past Surgical History   Procedure Laterality Date  . Leep  2005  . No past surgeries       Current outpatient prescriptions:  .  Fe Fum-Fe Poly-Vit C-Lactobac (FUSION) 65-65-25-30 MG CAPS, Take 1 capsule by mouth daily before breakfast., Disp: 30 capsule, Rfl: 5 .  medroxyPROGESTERone (DEPO-PROVERA) 150 MG/ML injection, Inject 1 mL (150 mg total) into the muscle every 3 (three) months., Disp: 1 mL, Rfl: 3  Current facility-administered medications:  .  medroxyPROGESTERone (DEPO-PROVERA) injection 150 mg, 150 mg, Intramuscular, Q90 days, Rachelle A Denney, CNM, 150 mg at 05/26/15 1122 No Known Allergies  Social History  Substance Use Topics  . Smoking status: Never Smoker   . Smokeless tobacco: Never Used  . Alcohol Use: No    Family History  Problem Relation Age of Onset  . Hypertension Mother       Review of Systems  Constitutional: negative for fatigue and weight loss Respiratory: negative for cough and wheezing Cardiovascular: negative for chest pain, fatigue and palpitations Gastrointestinal: negative for abdominal pain and change in bowel habits Musculoskeletal:negative for myalgias Neurological: negative for gait problems and tremors Behavioral/Psych: negative for abusive relationship, depression Endocrine: negative for temperature intolerance   Genitourinary:negative for abnormal menstrual periods, genital lesions, hot flashes, sexual problems and vaginal discharge Integument/breast: negative for breast lump, breast tenderness,  nipple discharge and skin lesion(s)    Objective:       BP 119/85 mmHg  Pulse 94  Temp(Src) 98.2 F (36.8 C)  Ht  (1.422 m)  Wt 151 lb 9.6 oz (68.765 kg)  BMI 34.01 kg/m2  LMP 05/23/2015 (Exact Date)  Breastfeeding? No General:   alert  Skin:   no rash or abnormalities  Lungs:   clear to auscultation bilaterally  Heart:   regular rate and rhythm, S1, S2 normal, no murmur, click, rub or gallop  Breasts:   normal without suspicious masses, skin or  nipple changes or axillary nodes  Abdomen:  normal findings: no organomegaly, soft, non-tender and no hernia  Pelvis:  External genitalia: normal general appearance Urinary system: urethral meatus normal and bladder without fullness, nontender Vaginal: normal without tenderness, induration or masses Cervix: normal appearance Adnexa: normal bimanual exam Uterus: anteverted and non-tender, normal size   Lab Review Urine pregnancy test Labs reviewed yes Radiologic studies reviewed no  50% of 30 min visit spent on counseling and coordination of care.   Assessment:    Healthy female exam.   ? BV  Obesity  Plan:    Education reviewed: calcium supplements, depression evaluation, low fat, low cholesterol diet, safe sex/STD prevention, self breast exams, skin cancer screening and weight bearing exercise. Contraception: Depo-Provera injections. Follow up in: 1 year.   Meds ordered this encounter  Medications  . medroxyPROGESTERone (DEPO-PROVERA) injection 150 mg    Sig:    No orders of the defined types were placed in this encounter.    Possible management options include: another form of contraception

## 2015-05-27 LAB — PAP IG W/ RFLX HPV ASCU

## 2015-08-11 ENCOUNTER — Ambulatory Visit (INDEPENDENT_AMBULATORY_CARE_PROVIDER_SITE_OTHER): Payer: Medicaid Other | Admitting: *Deleted

## 2015-08-11 VITALS — BP 112/74 | HR 82 | Temp 98.2°F | Wt 150.0 lb

## 2015-08-11 DIAGNOSIS — Z3042 Encounter for surveillance of injectable contraceptive: Secondary | ICD-10-CM | POA: Diagnosis not present

## 2015-08-11 NOTE — Progress Notes (Signed)
Patient in office for a Depo injection. Patient is on time for her injection. Patient tolerated injection well Patient due for her next injection on Nov 02, 2015.    BP 112/74 mmHg  Pulse 82  Temp(Src) 98.2 F (36.8 C)  Wt 150 lb (68.04 kg)  Administrations This Visit    medroxyPROGESTERone (DEPO-PROVERA) injection 150 mg    Admin Date Action Dose Route Administered By         08/11/2015 Given 150 mg Intramuscular Henriette Combs, LPN

## 2015-11-02 ENCOUNTER — Ambulatory Visit: Payer: Medicaid Other

## 2015-11-02 VITALS — BP 112/64 | Temp 98.7°F | Wt 150.0 lb

## 2015-11-02 DIAGNOSIS — Z3042 Encounter for surveillance of injectable contraceptive: Secondary | ICD-10-CM

## 2015-11-02 MED ORDER — MEDROXYPROGESTERONE ACETATE 150 MG/ML IM SUSP
150.0000 mg | Freq: Once | INTRAMUSCULAR | Status: AC
  Administered 2015-11-02: 150 mg via INTRAMUSCULAR

## 2015-11-02 NOTE — Progress Notes (Unsigned)
Pt here for depo - on time for injection. Tolerated inject well. Due for next Depo on 8/6

## 2016-01-22 ENCOUNTER — Ambulatory Visit (INDEPENDENT_AMBULATORY_CARE_PROVIDER_SITE_OTHER): Payer: Medicaid Other | Admitting: *Deleted

## 2016-01-22 DIAGNOSIS — Z3042 Encounter for surveillance of injectable contraceptive: Secondary | ICD-10-CM

## 2016-01-22 MED ORDER — MEDROXYPROGESTERONE ACETATE 150 MG/ML IM SUSP
150.0000 mg | Freq: Once | INTRAMUSCULAR | Status: AC
  Administered 2016-01-22: 150 mg via INTRAMUSCULAR

## 2016-01-22 NOTE — Progress Notes (Signed)
Patient ID: Judith Mcintosh, female   DOB: Oct 11, 1985, 31 y.o.   MRN: [id number]  Patient tolerated Depo Injection well in [facility]. RTO 04/14/16.

## 2016-04-10 ENCOUNTER — Other Ambulatory Visit: Payer: Self-pay | Admitting: Certified Nurse Midwife

## 2016-04-10 DIAGNOSIS — Z3009 Encounter for other general counseling and advice on contraception: Secondary | ICD-10-CM

## 2016-04-15 ENCOUNTER — Ambulatory Visit (INDEPENDENT_AMBULATORY_CARE_PROVIDER_SITE_OTHER): Payer: Medicaid Other

## 2016-04-15 DIAGNOSIS — Z3042 Encounter for surveillance of injectable contraceptive: Secondary | ICD-10-CM | POA: Diagnosis not present

## 2016-04-15 NOTE — Progress Notes (Signed)
Patient is in the office for depo injection, administered and patient tolerated well .Marland Kitchen. Administrations This Visit    medroxyPROGESTERone (DEPO-PROVERA) injection 150 mg    Admin Date 04/15/2016 Action Given Dose 150 mg Route Intramuscular Administered By Katrina StackBrittany D Christophr Calix, RN

## 2016-05-26 ENCOUNTER — Ambulatory Visit: Payer: Self-pay | Admitting: Certified Nurse Midwife

## 2016-06-22 ENCOUNTER — Ambulatory Visit: Payer: Medicaid Other | Admitting: Certified Nurse Midwife

## 2016-07-07 ENCOUNTER — Ambulatory Visit: Payer: Medicaid Other | Admitting: Obstetrics & Gynecology

## 2016-07-07 ENCOUNTER — Ambulatory Visit: Payer: Medicaid Other

## 2016-07-08 ENCOUNTER — Ambulatory Visit (INDEPENDENT_AMBULATORY_CARE_PROVIDER_SITE_OTHER): Payer: Medicaid Other

## 2016-07-08 DIAGNOSIS — Z3042 Encounter for surveillance of injectable contraceptive: Secondary | ICD-10-CM

## 2016-07-08 MED ORDER — MEDROXYPROGESTERONE ACETATE 150 MG/ML IM SUSP
150.0000 mg | Freq: Once | INTRAMUSCULAR | Status: AC
  Administered 2016-07-08: 150 mg via INTRAMUSCULAR

## 2016-07-20 ENCOUNTER — Other Ambulatory Visit (HOSPITAL_COMMUNITY)
Admission: RE | Admit: 2016-07-20 | Discharge: 2016-07-20 | Disposition: A | Discharge status: Home / Self Care | Payer: Medicaid Other | Source: Ambulatory Visit | Attending: Obstetrics | Admitting: Obstetrics

## 2016-07-20 ENCOUNTER — Ambulatory Visit (INDEPENDENT_AMBULATORY_CARE_PROVIDER_SITE_OTHER): Payer: Medicaid Other | Admitting: Obstetrics

## 2016-07-20 ENCOUNTER — Encounter: Payer: Self-pay | Admitting: Obstetrics

## 2016-07-20 VITALS — BP 115/76 | HR 106 | Wt 147.0 lb

## 2016-07-20 DIAGNOSIS — Z0000 Encounter for general adult medical examination without abnormal findings: Secondary | ICD-10-CM

## 2016-07-20 DIAGNOSIS — Z01419 Encounter for gynecological examination (general) (routine) without abnormal findings: Secondary | ICD-10-CM | POA: Diagnosis not present

## 2016-07-20 DIAGNOSIS — Z113 Encounter for screening for infections with a predominantly sexual mode of transmission: Secondary | ICD-10-CM | POA: Insufficient documentation

## 2016-07-20 DIAGNOSIS — Z01411 Encounter for gynecological examination (general) (routine) with abnormal findings: Secondary | ICD-10-CM | POA: Insufficient documentation

## 2016-07-20 DIAGNOSIS — Z1151 Encounter for screening for human papillomavirus (HPV): Secondary | ICD-10-CM | POA: Diagnosis present

## 2016-07-20 DIAGNOSIS — Z124 Encounter for screening for malignant neoplasm of cervix: Secondary | ICD-10-CM

## 2016-07-20 DIAGNOSIS — Z3042 Encounter for surveillance of injectable contraceptive: Secondary | ICD-10-CM

## 2016-07-20 NOTE — Progress Notes (Signed)
Subjective:        Judith Mcintosh is a 31 y.o. female here for a routine exam.  Current complaints: None.    Personal health questionnaire:  Is patient Ashkenazi Jewish, have a family history of breast and/or ovarian cancer: no Is there a family history of uterine cancer diagnosed at age < 6450, gastrointestinal cancer, urinary tract cancer, family member who is a Personnel officerLynch syndrome-associated carrier: no Is the patient overweight and hypertensive, family history of diabetes, personal history of gestational diabetes, preeclampsia or PCOS: no Is patient over 5055, have PCOS,  family history of premature CHD under age 31, diabetes, smoke, have hypertension or peripheral artery disease:  no At any time, has a partner hit, kicked or otherwise hurt or frightened you?: no Over the past 2 weeks, have you felt down, depressed or hopeless?: no Over the past 2 weeks, have you felt little interest or pleasure in doing things?:no   Gynecologic History No LMP recorded. Patient has had an injection. Contraception: Depo-Provera injections Last Pap: 2016. Results were: normal Last mammogram: n/a. Results were: n/a  Obstetric History OB History  Gravida Para Term Preterm AB Living  4 4 3 1   4   SAB TAB Ectopic Multiple Live Births  0 0 0 0 4    # Outcome Date GA Lbr Len/2nd Weight Sex Delivery Anes PTL Lv  4 Term 10/28/14 [id number] 03:31 / 00:39 7 lb 12.5 oz (3.53 kg) F Vag-Spont EPI  LIV     Birth Comments: WNL  3 Preterm 04/21/11 4479w3d 11:13 / 00:35 6 lb 5.8 oz (2.886 kg) F Vag-Spont EPI  LIV  2 Term      Vag-Spont   LIV  1 Term      Vag-Spont   LIV      Past Medical History:  Diagnosis Date  . Abnormal Pap smear 2005   LEEP  . Headache(784.0) Migraine  . Irritable bowel syndrome (IBS)     Past Surgical History:  Procedure Laterality Date  . LEEP  2005  . NO PAST SURGERIES       Current Outpatient Prescriptions:  .  medroxyPROGESTERone (DEPO-PROVERA) 150 MG/ML injection, INJECT 1 ML  (150 MG TOTAL) INTO THE MUSCLE EVERY 3 (THREE) MONTHS., Disp: 1 mL, Rfl: 3 No Known Allergies  Social History  Substance Use Topics  . Smoking status: Never Smoker  . Smokeless tobacco: Never Used  . Alcohol use No    Family History  Problem Relation Age of Onset  . Hypertension Mother       Review of Systems  Constitutional: negative for fatigue and weight loss Respiratory: negative for cough and wheezing Cardiovascular: negative for chest pain, fatigue and palpitations Gastrointestinal: negative for abdominal pain and change in bowel habits Musculoskeletal:negative for myalgias Neurological: negative for gait problems and tremors Behavioral/Psych: negative for abusive relationship, depression Endocrine: negative for temperature intolerance    Genitourinary:negative for abnormal menstrual periods, genital lesions, hot flashes, sexual problems and vaginal discharge Integument/breast: negative for breast lump, breast tenderness, nipple discharge and skin lesion(s)    Objective:       BP 115/76   Pulse (!) 106   Wt 147 lb (66.7 kg)   Breastfeeding? No   BMI 32.96 kg/m  General:   alert  Skin:   no rash or abnormalities  Lungs:   clear to auscultation bilaterally  Heart:   regular rate and rhythm, S1, S2 normal, no murmur, click, rub or gallop  Breasts:  normal without suspicious masses, skin or nipple changes or axillary nodes  Abdomen:  normal findings: no organomegaly, soft, non-tender and no hernia  Pelvis:  External genitalia: normal general appearance Urinary system: urethral meatus normal and bladder without fullness, nontender Vaginal: normal without tenderness, induration or masses Cervix: normal appearance Adnexa: normal bimanual exam Uterus: anteverted and non-tender, normal size   Lab Review Urine pregnancy test Labs reviewed yes Radiologic studies reviewed no  50% of 20 min visit spent on counseling and coordination of care.    Assessment:     Healthy female exam.    Contraceptive management.  Pleased with Dep Provera.   Plan:    Education reviewed: calcium supplements, low fat, low cholesterol diet, safe sex/STD prevention, self breast exams and weight bearing exercise. Contraception: Depo-Provera injections. Follow up in: 1 year.   No orders of the defined types were placed in this encounter.  No orders of the defined types were placed in this encounter.   Patient ID: Judith Mcintosh, female   DOB: 08-19-1985, 31 y.o.   MRN: [id number]

## 2016-07-20 NOTE — Progress Notes (Signed)
Pt presents for annual and pap smear. STD testing declined.

## 2016-07-21 LAB — CERVICOVAGINAL ANCILLARY ONLY
Bacterial vaginitis: POSITIVE — AB
CHLAMYDIA, DNA PROBE: NEGATIVE
Candida vaginitis: NEGATIVE
NEISSERIA GONORRHEA: NEGATIVE
Trichomonas: NEGATIVE

## 2016-07-22 ENCOUNTER — Other Ambulatory Visit: Payer: Self-pay | Admitting: Obstetrics

## 2016-07-22 ENCOUNTER — Telehealth: Payer: Self-pay

## 2016-07-22 DIAGNOSIS — N760 Acute vaginitis: Principal | ICD-10-CM

## 2016-07-22 DIAGNOSIS — B9689 Other specified bacterial agents as the cause of diseases classified elsewhere: Secondary | ICD-10-CM

## 2016-07-22 MED ORDER — TINIDAZOLE 500 MG PO TABS: 1000.0000 mg | ORAL_TABLET | Freq: Every day | ORAL | 2 refills | Status: DC

## 2016-07-22 NOTE — Telephone Encounter (Signed)
[facility] to [facility]. PA sent through [facility] and approved.

## 2016-07-25 LAB — CYTOLOGY - PAP
DIAGNOSIS: NEGATIVE
HPV: NOT DETECTED

## 2016-09-29 ENCOUNTER — Ambulatory Visit (INDEPENDENT_AMBULATORY_CARE_PROVIDER_SITE_OTHER): Payer: Medicaid Other

## 2016-09-29 VITALS — BP 107/73 | HR 97 | Wt 151.0 lb

## 2016-09-29 DIAGNOSIS — Z3042 Encounter for surveillance of injectable contraceptive: Secondary | ICD-10-CM

## 2016-09-29 MED ORDER — MEDROXYPROGESTERONE ACETATE 150 MG/ML IM SUSP
150.0000 mg | Freq: Once | INTRAMUSCULAR | Status: AC
  Administered 2016-09-29: 150 mg via INTRAMUSCULAR

## 2016-09-29 NOTE — Progress Notes (Signed)
Patient presents for DEPO. Given in LUOQ. Tolerated well.  Next DEPO 6/28-7/05/2017.  Administrations This Visit    medroxyPROGESTERone (DEPO-PROVERA) injection 150 mg    Admin Date 09/29/2016 Action Given Dose 150 mg Route Intramuscular Administered By Maretta Bees, RMA

## 2016-12-15 ENCOUNTER — Ambulatory Visit: Payer: Medicaid Other

## 2016-12-26 ENCOUNTER — Ambulatory Visit (INDEPENDENT_AMBULATORY_CARE_PROVIDER_SITE_OTHER): Payer: Medicaid Other

## 2016-12-26 DIAGNOSIS — Z3042 Encounter for surveillance of injectable contraceptive: Secondary | ICD-10-CM | POA: Diagnosis not present

## 2016-12-26 MED ORDER — MEDROXYPROGESTERONE ACETATE 150 MG/ML IM SUSP
150.0000 mg | Freq: Once | INTRAMUSCULAR | Status: AC
  Administered 2016-12-26: 150 mg via INTRAMUSCULAR

## 2016-12-26 NOTE — Progress Notes (Signed)
Nurse visit for pt supplied Depo given R upper outer quad w/o difficulty. Next Depo due 9/24

## 2017-03-16 ENCOUNTER — Ambulatory Visit: Payer: Medicaid Other

## 2017-03-17 ENCOUNTER — Ambulatory Visit (INDEPENDENT_AMBULATORY_CARE_PROVIDER_SITE_OTHER): Payer: Medicaid Other

## 2017-03-17 DIAGNOSIS — Z3042 Encounter for surveillance of injectable contraceptive: Secondary | ICD-10-CM

## 2017-03-17 MED ORDER — MEDROXYPROGESTERONE ACETATE 150 MG/ML IM SUSP
150.0000 mg | Freq: Once | INTRAMUSCULAR | Status: AC
  Administered 2017-03-17: 150 mg via INTRAMUSCULAR

## 2017-03-17 NOTE — Progress Notes (Signed)
Nurse visit for pt supplied Depo given L upper outer quad w/o difficulty. Next Depo due Dec 14-Dec 28. Pt agrees.

## 2017-05-29 ENCOUNTER — Other Ambulatory Visit: Payer: Self-pay | Admitting: Obstetrics

## 2017-05-29 DIAGNOSIS — Z3009 Encounter for other general counseling and advice on contraception: Secondary | ICD-10-CM

## 2017-06-02 ENCOUNTER — Ambulatory Visit (INDEPENDENT_AMBULATORY_CARE_PROVIDER_SITE_OTHER): Payer: Medicaid Other

## 2017-06-02 DIAGNOSIS — Z3042 Encounter for surveillance of injectable contraceptive: Secondary | ICD-10-CM

## 2017-06-02 MED ORDER — MEDROXYPROGESTERONE ACETATE 150 MG/ML IM SUSP
150.0000 mg | Freq: Once | INTRAMUSCULAR | Status: AC
  Administered 2017-06-02: 150 mg via INTRAMUSCULAR

## 2017-06-02 NOTE — Progress Notes (Signed)
Pt here for depo shot. Last depo given 03/17/17. Inj given in left upper outer quad. Pt tolerated well. Next depo due 3/2- 3/16

## 2017-08-24 ENCOUNTER — Ambulatory Visit (INDEPENDENT_AMBULATORY_CARE_PROVIDER_SITE_OTHER): Payer: Medicaid Other

## 2017-08-24 DIAGNOSIS — Z3042 Encounter for surveillance of injectable contraceptive: Secondary | ICD-10-CM

## 2017-08-24 MED ORDER — MEDROXYPROGESTERONE ACETATE 150 MG/ML IM SUSP
150.0000 mg | Freq: Once | INTRAMUSCULAR | Status: AC
  Administered 2017-08-24: 150 mg via INTRAMUSCULAR

## 2017-08-24 NOTE — Progress Notes (Signed)
Pt here for depo shot. Inj given in right upper outer quad. Pt tolerate well. Next Depo due 5/23-6/6

## 2017-08-24 NOTE — Progress Notes (Signed)
I have reviewed this chart and agree with the RN/CMA assessment and management.    K. Meryl Davis, M.D. Attending Obstetrician & Gynecologist, [facility], [facility]  

## 2017-11-15 ENCOUNTER — Ambulatory Visit (INDEPENDENT_AMBULATORY_CARE_PROVIDER_SITE_OTHER): Payer: Medicaid Other | Admitting: Obstetrics

## 2017-11-15 VITALS — BP 136/90 | HR 100 | Wt 146.4 lb

## 2017-11-15 DIAGNOSIS — Z3042 Encounter for surveillance of injectable contraceptive: Secondary | ICD-10-CM | POA: Diagnosis not present

## 2017-11-15 MED ORDER — MEDROXYPROGESTERONE ACETATE 150 MG/ML IM SUSP
150.0000 mg | Freq: Once | INTRAMUSCULAR | Status: AC
  Administered 2017-11-15: 150 mg via INTRAMUSCULAR

## 2017-11-15 NOTE — Progress Notes (Signed)
Pt. Is here for depo injection. Injection given in L upper quad. Pt tolerated well.

## 2018-01-25 ENCOUNTER — Ambulatory Visit: Payer: Medicaid Other | Admitting: Obstetrics and Gynecology

## 2018-02-07 ENCOUNTER — Encounter: Payer: Self-pay | Admitting: Obstetrics & Gynecology

## 2018-02-07 ENCOUNTER — Ambulatory Visit (INDEPENDENT_AMBULATORY_CARE_PROVIDER_SITE_OTHER): Payer: Medicaid Other | Admitting: Obstetrics & Gynecology

## 2018-02-07 ENCOUNTER — Ambulatory Visit: Payer: Medicaid Other

## 2018-02-07 VITALS — BP 119/85 | HR 99 | Ht <= 58 in | Wt 151.8 lb

## 2018-02-07 DIAGNOSIS — Z3009 Encounter for other general counseling and advice on contraception: Secondary | ICD-10-CM

## 2018-02-07 DIAGNOSIS — Z0000 Encounter for general adult medical examination without abnormal findings: Secondary | ICD-10-CM

## 2018-02-07 DIAGNOSIS — Z3042 Encounter for surveillance of injectable contraceptive: Secondary | ICD-10-CM

## 2018-02-07 DIAGNOSIS — Z01419 Encounter for gynecological examination (general) (routine) without abnormal findings: Secondary | ICD-10-CM

## 2018-02-07 MED ORDER — MEDROXYPROGESTERONE ACETATE 150 MG/ML IM SUSP: INTRAMUSCULAR | 3 refills | Status: DC

## 2018-02-07 MED ORDER — MEDROXYPROGESTERONE ACETATE 150 MG/ML IM SUSP
150.0000 mg | Freq: Once | INTRAMUSCULAR | Status: AC
  Administered 2018-02-07: 150 mg via INTRAMUSCULAR

## 2018-02-07 NOTE — Progress Notes (Signed)
Pt here for AEX and depo shot. Pt is within her window. Inj given in RUOQ. Pt tolerated well. Next depo due Nov.6 -Nov.20

## 2018-02-07 NOTE — Progress Notes (Signed)
Patient ID: Judith Mcintosh, female   DOB: 01/24/1986, 32 y.o.   MRN: [id number]  Chief Complaint  Patient presents with  . Gynecologic Exam    HPI Judith Mcintosh is a 32 y.o. female.  [id number] No LMP recorded. Patient has had an injection. She is due for her Gyn exam and Depo Provera today. Her last pap 06/2016 was normal and will not be repeated today. HPI  Past Medical History:  Diagnosis Date  . Abnormal Pap smear 2005   LEEP  . Headache(784.0) Migraine  . Irritable bowel syndrome (IBS)     Past Surgical History:  Procedure Laterality Date  . LEEP  2005  . NO PAST SURGERIES      Family History  Problem Relation Age of Onset  . Hypertension Mother     Social History Social History   Tobacco Use  . Smoking status: Never Smoker  . Smokeless tobacco: Never Used  Substance Use Topics  . Alcohol use: No    Alcohol/week: 0.0 standard drinks  . Drug use: No    No Known Allergies  Current Outpatient Medications  Medication Sig Dispense Refill  . medroxyPROGESTERone (DEPO-PROVERA) 150 MG/ML injection INJECT INTO MUSCLE EVERY 3 MONTHS 1 mL 3   No current facility-administered medications for this visit.     Review of Systems Review of Systems  Constitutional: Negative.   Respiratory: Negative.   Gastrointestinal: Negative.   Endocrine: Negative.   Genitourinary: Negative.     Blood pressure 119/85, pulse 99, height 4\' 8"  (1.422 m), weight 68.9 kg. Physical Exam Physical Exam  Constitutional: She is oriented to person, place, and time. She appears well-developed. No distress.  Eyes: Pupils are equal, round, and reactive to light.  Neck: Normal range of motion.  Cardiovascular: Normal rate.  Pulmonary/Chest: Effort normal. No respiratory distress.  Abdominal: Soft. She exhibits no distension. There is no tenderness.  Genitourinary:  Genitourinary Comments: Deferred today, pap not done  Neurological: She is alert and oriented to person, place, and time.   Vitals reviewed. Breasts: breasts appear normal, no suspicious masses, no skin or nipple changes or axillary nodes.   Data Reviewed Pap 2018 normal  Assessment    Normal well woman exam Doing well on depo provera but would consider LARC    Plan    RTC 3 months for DMPA Information given about Nexplanon Discussed BSE       Scheryl DarterJames Arnold 02/07/2018, 10:47 AM

## 2018-02-07 NOTE — Patient Instructions (Signed)
Etonogestrel implant What is this medicine? ETONOGESTREL (et oh noe JES trel) is a contraceptive (birth control) device. It is used to prevent pregnancy. It can be used for up to 3 years. This medicine may be used for other purposes; ask your health care provider or pharmacist if you have questions. COMMON BRAND NAME(S): Implanon, Nexplanon What should I tell my health care provider before I take this medicine? They need to know if you have any of these conditions: -abnormal vaginal bleeding -blood vessel disease or blood clots -cancer of the breast, cervix, or liver -depression -diabetes -gallbladder disease -headaches -heart disease or recent heart attack -high blood pressure -high cholesterol -kidney disease -liver disease -renal disease -seizures -tobacco smoker -an unusual or allergic reaction to etonogestrel, other hormones, anesthetics or antiseptics, medicines, foods, dyes, or preservatives -pregnant or trying to get pregnant -breast-feeding How should I use this medicine? This device is inserted just under the skin on the inner side of your upper arm by a health care professional. Talk to your pediatrician regarding the use of this medicine in children. Special care may be needed. Overdosage: If you think you have taken too much of this medicine contact a poison control center or emergency room at once. NOTE: This medicine is only for you. Do not share this medicine with others. What if I miss a dose? This does not apply. What may interact with this medicine? Do not take this medicine with any of the following medications: -amprenavir -bosentan -fosamprenavir This medicine may also interact with the following medications: -barbiturate medicines for inducing sleep or treating seizures -certain medicines for fungal infections like ketoconazole and itraconazole -grapefruit juice -griseofulvin -medicines to treat seizures like carbamazepine, felbamate, oxcarbazepine,  phenytoin, topiramate -modafinil -phenylbutazone -rifampin -rufinamide -some medicines to treat HIV infection like atazanavir, indinavir, lopinavir, nelfinavir, tipranavir, ritonavir -St. John's wort This list may not describe all possible interactions. Give your health care provider a list of all the medicines, herbs, non-prescription drugs, or dietary supplements you use. Also tell them if you smoke, drink alcohol, or use illegal drugs. Some items may interact with your medicine. What should I watch for while using this medicine? This product does not protect you against HIV infection (AIDS) or other sexually transmitted diseases. You should be able to feel the implant by pressing your fingertips over the skin where it was inserted. Contact your doctor if you cannot feel the implant, and use a non-hormonal birth control method (such as condoms) until your doctor confirms that the implant is in place. If you feel that the implant may have broken or become bent while in your arm, contact your healthcare provider. What side effects may I notice from receiving this medicine? Side effects that you should report to your doctor or health care professional as soon as possible: -allergic reactions like skin rash, itching or hives, swelling of the face, lips, or tongue -breast lumps -changes in emotions or moods -depressed mood -heavy or prolonged menstrual bleeding -pain, irritation, swelling, or bruising at the insertion site -scar at site of insertion -signs of infection at the insertion site such as fever, and skin redness, pain or discharge -signs of pregnancy -signs and symptoms of a blood clot such as breathing problems; changes in vision; chest pain; severe, sudden headache; pain, swelling, warmth in the leg; trouble speaking; sudden numbness or weakness of the face, arm or leg -signs and symptoms of liver injury like dark yellow or brown urine; general ill feeling or flu-like symptoms;  light-colored   stools; loss of appetite; nausea; right upper belly pain; unusually weak or tired; yellowing of the eyes or skin -unusual vaginal bleeding, discharge -signs and symptoms of a stroke like changes in vision; confusion; trouble speaking or understanding; severe headaches; sudden numbness or weakness of the face, arm or leg; trouble walking; dizziness; loss of balance or coordination Side effects that usually do not require medical attention (report to your doctor or health care professional if they continue or are bothersome): -acne -back pain -breast pain -changes in weight -dizziness -general ill feeling or flu-like symptoms -headache -irregular menstrual bleeding -nausea -sore throat -vaginal irritation or inflammation This list may not describe all possible side effects. Call your doctor for medical advice about side effects. You may report side effects to FDA at 1-800-FDA-1088. Where should I keep my medicine? This drug is given in a hospital or clinic and will not be stored at home. NOTE: This sheet is a summary. It may not cover all possible information. If you have questions about this medicine, talk to your doctor, pharmacist, or health care provider.  2018 Elsevier/Gold Standard (2015-12-24 11:19:22)  

## 2018-03-14 ENCOUNTER — Other Ambulatory Visit: Payer: Self-pay

## 2018-03-14 MED ORDER — METRONIDAZOLE 500 MG PO TABS: 500.0000 mg | ORAL_TABLET | Freq: Two times a day (BID) | ORAL | 0 refills | Status: DC

## 2018-03-14 NOTE — Progress Notes (Signed)
Pt c/o vaginal discharge with odor. Metronidazole 500 mg 1 tab po [facility]. Pt aware if sx's do not subside to contact the office for an appt.

## 2018-04-25 ENCOUNTER — Ambulatory Visit (INDEPENDENT_AMBULATORY_CARE_PROVIDER_SITE_OTHER): Payer: Medicaid Other

## 2018-04-25 VITALS — BP 127/82 | HR 108 | Wt 155.3 lb

## 2018-04-25 DIAGNOSIS — Z3042 Encounter for surveillance of injectable contraceptive: Secondary | ICD-10-CM | POA: Diagnosis not present

## 2018-04-25 MED ORDER — MEDROXYPROGESTERONE ACETATE 150 MG/ML IM SUSP
150.0000 mg | Freq: Once | INTRAMUSCULAR | Status: AC
  Administered 2018-04-25: 150 mg via INTRAMUSCULAR

## 2018-04-25 NOTE — Progress Notes (Signed)
Presents for DEPO, given in LUOQ, tolerated well. Next DEPO Jan.22-Feb. 10/2018  Administrations This Visit    medroxyPROGESTERone (DEPO-PROVERA) injection 150 mg    Admin Date 04/25/2018 Action Given Dose 150 mg Route Intramuscular Administered By Maretta Bees, RMA

## 2018-07-17 ENCOUNTER — Ambulatory Visit (INDEPENDENT_AMBULATORY_CARE_PROVIDER_SITE_OTHER): Payer: Medicaid Other

## 2018-07-17 DIAGNOSIS — Z3042 Encounter for surveillance of injectable contraceptive: Secondary | ICD-10-CM | POA: Diagnosis not present

## 2018-07-17 MED ORDER — MEDROXYPROGESTERONE ACETATE 150 MG/ML IM SUSP
150.0000 mg | Freq: Once | INTRAMUSCULAR | Status: AC
  Administered 2018-07-17: 150 mg via INTRAMUSCULAR

## 2018-07-17 NOTE — Progress Notes (Signed)
Pt is here for depo injection, pt is on time. Injection given in LUOQ, pt tolerated well. Next injection due 4/15-4/29.

## 2018-08-01 ENCOUNTER — Encounter (HOSPITAL_COMMUNITY): Payer: Self-pay

## 2018-08-01 ENCOUNTER — Ambulatory Visit (HOSPITAL_COMMUNITY)
Admission: EM | Admit: 2018-08-01 | Discharge: 2018-08-01 | Disposition: A | Discharge status: Home / Self Care | Payer: Medicaid Other | Attending: Family Medicine | Admitting: Family Medicine

## 2018-08-01 ENCOUNTER — Ambulatory Visit (INDEPENDENT_AMBULATORY_CARE_PROVIDER_SITE_OTHER): Payer: Medicaid Other

## 2018-08-01 DIAGNOSIS — M7651 Patellar tendinitis, right knee: Secondary | ICD-10-CM | POA: Diagnosis not present

## 2018-08-01 DIAGNOSIS — M25571 Pain in right ankle and joints of right foot: Secondary | ICD-10-CM | POA: Diagnosis not present

## 2018-08-01 DIAGNOSIS — S93402A Sprain of unspecified ligament of left ankle, initial encounter: Secondary | ICD-10-CM

## 2018-08-01 DIAGNOSIS — W19XXXA Unspecified fall, initial encounter: Secondary | ICD-10-CM

## 2018-08-01 DIAGNOSIS — M25572 Pain in left ankle and joints of left foot: Secondary | ICD-10-CM | POA: Diagnosis not present

## 2018-08-01 DIAGNOSIS — S99912A Unspecified injury of left ankle, initial encounter: Secondary | ICD-10-CM | POA: Diagnosis not present

## 2018-08-01 DIAGNOSIS — S93401A Sprain of unspecified ligament of right ankle, initial encounter: Secondary | ICD-10-CM

## 2018-08-01 DIAGNOSIS — S99911A Unspecified injury of right ankle, initial encounter: Secondary | ICD-10-CM | POA: Diagnosis not present

## 2018-08-01 MED ORDER — NAPROXEN 375 MG PO TABS: 375.0000 mg | ORAL_TABLET | Freq: Two times a day (BID) | ORAL | 0 refills | Status: DC

## 2018-08-01 NOTE — ED Provider Notes (Signed)
[facility]   [id number] 08/01/18 Arrival Time: 0809  CC: Right knee and bilateral ankle pain  SUBJECTIVE: History from: patient. Judith Mcintosh is a 33 y.o. female complains of right knee and bilateral ankle pain that occurred yesterday after she slipped and fell in the parking lot.  States right leg went behind her, and left ankle twisted outwards.  Localizes pain to the anterior right knee, and diffuse about bilateral ankles.  Describes the pain as constant and achy in character.  8-9/10.  Has NOT tried OTC medications without relief.  Symptoms are made worse with walking.  Denies similar symptoms in the past.  Denies fever, chills, erythema, ecchymosis, effusion, weakness, numbness and tingling.      ROS: As per HPI.  Past Medical History:  Diagnosis Date  . Abnormal Pap smear 2005   LEEP  . Headache(784.0) Migraine  . Irritable bowel syndrome (IBS)    Past Surgical History:  Procedure Laterality Date  . LEEP  2005  . NO PAST SURGERIES     No Known Allergies No current facility-administered medications on file prior to encounter.    Current Outpatient Medications on File Prior to Encounter  Medication Sig Dispense Refill  . medroxyPROGESTERone (DEPO-PROVERA) 150 MG/ML injection INJECT INTO MUSCLE EVERY 3 MONTHS 1 mL 3   Social History   Socioeconomic History  . Marital status: Single    Spouse name: Not on file  . Number of children: Not on file  . Years of education: Not on file  . Highest education level: Not on file  Occupational History  . Not on file  Social Needs  . Financial resource strain: Not on file  . Food insecurity:    Worry: Not on file    Inability: Not on file  . Transportation needs:    Medical: Not on file    Non-medical: Not on file  Tobacco Use  . Smoking status: Never Smoker  . Smokeless tobacco: Never Used  Substance and Sexual Activity  . Alcohol use: No    Alcohol/week: 0.0 standard drinks  . Drug use: No  . Sexual  activity: Yes    Partners: Male    Birth control/protection: Injection  Lifestyle  . Physical activity:    Days per week: Not on file    Minutes per session: Not on file  . Stress: Not on file  Relationships  . Social connections:    Talks on phone: Not on file    Gets together: Not on file    Attends religious service: Not on file    Active member of club or organization: Not on file    Attends meetings of clubs or organizations: Not on file    Relationship status: Not on file  . Intimate partner violence:    Fear of current or ex partner: Not on file    Emotionally abused: Not on file    Physically abused: Not on file    Forced sexual activity: Not on file  Other Topics Concern  . Not on file  Social History Narrative  . Not on file   Family History  Problem Relation Age of Onset  . Hypertension Mother     OBJECTIVE:  Vitals:   08/01/18 0841  BP: 127/78  Pulse: 87  Resp: 16  Temp: 98.4 F (36.9 C)  TempSrc: Oral  SpO2: 100%    General appearance: Alert; in no acute distress.  Head: NCAT Lungs: Normal respiratory effort CV: dorsalis pedis pulse 2+  and intact bilaterally; cap refill <2 sec about bilateral toes Musculoskeletal: Right knee and bilateral ankle Inspection: Skin warm, dry, clear and intact.  Mild swelling to bilateral ankles Palpation: TTP over anterior knee, patellar ligament, and medial and lateral aspects of the patella; diffusely TTP over bilateral ankles, most tender about the lateral malleolus of the RT ankle ROM: FROM about right knee and bilateral ankle; mild discomfort with ankle eversion bilaterally Strength: 5/5 knee flexion, 5/5 knee extension, 5/5 dorsiflexion, 5/5 plantar flexion Stability: Anterior/ posterior drawer intact RT knee Skin: warm and dry Neurologic: Antalgic gait; Sensation intact about the lower extremities Psychological: alert and cooperative; normal mood and affect  DIAGNOSTIC STUDIES:  Dg Ankle Complete  Left  Result Date: 08/01/2018 CLINICAL DATA:  Fall with bilateral ankle pain.  Initial encounter. EXAM: LEFT ANKLE COMPLETE - 3+ VIEW COMPARISON:  None. FINDINGS: There is no evidence of fracture, dislocation, or joint effusion. There is no evidence of arthropathy or other focal bone abnormality. Soft tissues are unremarkable. IMPRESSION: Negative. Electronically Signed   By: Marnee Spring M.D.   On: 08/01/2018 09:49   Dg Ankle Complete Right  Result Date: 08/01/2018 CLINICAL DATA:  Fall with bilateral ankle pain.  Initial encounter. EXAM: RIGHT ANKLE - COMPLETE 3+ VIEW COMPARISON:  None. FINDINGS: There is no evidence of fracture, dislocation, or joint effusion. There is no evidence of arthropathy or other focal bone abnormality. Soft tissues are unremarkable. IMPRESSION: Negative. Electronically Signed   By: Marnee Spring M.D.   On: 08/01/2018 09:50     ASSESSMENT & PLAN:  1. Patellar tendinitis of right knee   2. Sprain of right ankle, unspecified ligament, initial encounter   3. Sprain of left ankle, unspecified ligament, initial encounter   4. Fall, initial encounter     Meds ordered this encounter  Medications  . naproxen (NAPROSYN) 375 MG tablet    Sig: Take 1 tablet (375 mg total) by mouth 2 (two) times daily.    Dispense:  20 tablet    Refill:  0    Order Specific Question:   Supervising Provider    Answer:   Eustace Moore [[id number]]   X-rays did not show fracture or dislocation Brace and crutches given.  Use as needed for comfort. Continue conservative management of rest, ice, elevation, and gentle stretches Take naproxen as needed for pain relief (may cause abdominal discomfort, ulcers, and GI bleeds avoid taking with other NSAIDs) Follow up with PCP or orthopedist if symptoms persist Return or go to the ER if you have any new or worsening symptoms (fever, chills, worsening ankle or knee pain, increased redness, swelling, etc...)    Reviewed expectations re:  course of current medical issues. Questions answered. Outlined signs and symptoms indicating need for more acute intervention. Patient verbalized understanding. After Visit Summary given.    Rennis Harding, PA-C 08/01/18 1144

## 2018-08-01 NOTE — ED Triage Notes (Signed)
Pt presents with pain in both ankles and right leg from fall yesterday.

## 2018-08-01 NOTE — Discharge Instructions (Addendum)
X-rays did not show fracture or dislocation Brace and crutches given.  Use as needed for comfort. Continue conservative management of rest, ice, elevation, and gentle stretches Take naproxen as needed for pain relief (may cause abdominal discomfort, ulcers, and GI bleeds avoid taking with other NSAIDs) Follow up with PCP/ [facility] or orthopedist if symptoms persist Return or go to the ER if you have any new or worsening symptoms (fever, chills, worsening ankle or knee pain, increased redness, swelling, etc...)

## 2018-08-27 ENCOUNTER — Other Ambulatory Visit: Payer: Self-pay

## 2018-08-27 DIAGNOSIS — N898 Other specified noninflammatory disorders of vagina: Secondary | ICD-10-CM

## 2018-08-27 MED ORDER — METRONIDAZOLE 500 MG PO TABS: 500.0000 mg | ORAL_TABLET | Freq: Two times a day (BID) | ORAL | 0 refills | Status: DC

## 2018-08-27 NOTE — Progress Notes (Signed)
Pt called with symptoms of discharge and a fishy odor. Sent RX flagyl, pt aware if symptoms do not improve she will need to come in and be evaluated. Pt verbalized understanding.

## 2018-10-05 ENCOUNTER — Ambulatory Visit: Payer: Medicaid Other

## 2018-10-09 ENCOUNTER — Other Ambulatory Visit: Payer: Self-pay

## 2018-10-09 ENCOUNTER — Ambulatory Visit (INDEPENDENT_AMBULATORY_CARE_PROVIDER_SITE_OTHER): Payer: Medicaid Other

## 2018-10-09 VITALS — BP 134/82 | HR 130 | Temp 99.1°F | Ht <= 58 in | Wt 158.0 lb

## 2018-10-09 DIAGNOSIS — Z3042 Encounter for surveillance of injectable contraceptive: Secondary | ICD-10-CM

## 2018-10-09 MED ORDER — MEDROXYPROGESTERONE ACETATE 150 MG/ML IM SUSP
150.0000 mg | Freq: Once | INTRAMUSCULAR | Status: AC
  Administered 2018-10-09: 11:00:00 150 mg via INTRAMUSCULAR

## 2018-10-09 NOTE — Progress Notes (Signed)
Presented for DEPO, given in RUOQ, tolerated well.  Next DEPO July 7-21/2020  Administrations This Visit    medroxyPROGESTERone (DEPO-PROVERA) injection 150 mg    Admin Date 10/09/2018 Action Given Dose 150 mg Route Intramuscular Administered By Maretta Bees, RMA

## 2018-12-31 ENCOUNTER — Other Ambulatory Visit: Payer: Self-pay

## 2018-12-31 ENCOUNTER — Ambulatory Visit (INDEPENDENT_AMBULATORY_CARE_PROVIDER_SITE_OTHER): Payer: Medicaid Other

## 2018-12-31 VITALS — BP 137/87 | HR 131 | Ht <= 58 in | Wt 164.0 lb

## 2018-12-31 DIAGNOSIS — Z3042 Encounter for surveillance of injectable contraceptive: Secondary | ICD-10-CM

## 2018-12-31 MED ORDER — MEDROXYPROGESTERONE ACETATE 150 MG/ML IM SUSP
150.0000 mg | Freq: Once | INTRAMUSCULAR | Status: AC
  Administered 2018-12-31: 150 mg via INTRAMUSCULAR

## 2018-12-31 NOTE — Progress Notes (Signed)
Presented for DEPO, Injection given in LUOQ, tolerated well.  Next AEX & DEPO Sept. 28 - Oct.05/2019  Administrations This Visit    medroxyPROGESTERone (DEPO-PROVERA) injection 150 mg    Admin Date 12/31/2018 Action Given Dose 150 mg Route Intramuscular Administered By Tamela Oddi, RMA

## 2019-03-18 ENCOUNTER — Other Ambulatory Visit: Payer: Self-pay | Admitting: *Deleted

## 2019-03-18 DIAGNOSIS — Z3009 Encounter for other general counseling and advice on contraception: Secondary | ICD-10-CM

## 2019-03-18 DIAGNOSIS — N898 Other specified noninflammatory disorders of vagina: Secondary | ICD-10-CM

## 2019-03-18 MED ORDER — MEDROXYPROGESTERONE ACETATE 150 MG/ML IM SUSP: INTRAMUSCULAR | 0 refills | Status: DC

## 2019-03-18 NOTE — Telephone Encounter (Signed)
Patient has an appointment tomorrow 03/19/2019 for annual and Depo Provera injection. She is requesting refill for Depo Provera. Refill sent to pharmacy.  Derl Barrow, RN

## 2019-03-19 ENCOUNTER — Encounter: Payer: Self-pay | Admitting: Obstetrics and Gynecology

## 2019-03-19 ENCOUNTER — Ambulatory Visit: Payer: Medicaid Other | Admitting: Obstetrics and Gynecology

## 2019-03-19 ENCOUNTER — Other Ambulatory Visit: Payer: Self-pay

## 2019-03-19 VITALS — BP 141/94 | HR 121 | Ht <= 58 in | Wt 165.0 lb

## 2019-03-19 DIAGNOSIS — Z0000 Encounter for general adult medical examination without abnormal findings: Secondary | ICD-10-CM

## 2019-03-19 DIAGNOSIS — Z3009 Encounter for other general counseling and advice on contraception: Secondary | ICD-10-CM

## 2019-03-19 DIAGNOSIS — Z3042 Encounter for surveillance of injectable contraceptive: Secondary | ICD-10-CM

## 2019-03-19 DIAGNOSIS — Z01419 Encounter for gynecological examination (general) (routine) without abnormal findings: Secondary | ICD-10-CM | POA: Insufficient documentation

## 2019-03-19 MED ORDER — MEDROXYPROGESTERONE ACETATE 150 MG/ML IM SUSP
150.0000 mg | Freq: Once | INTRAMUSCULAR | Status: AC
  Administered 2019-03-19: 150 mg via INTRAMUSCULAR

## 2019-03-19 MED ORDER — MEDROXYPROGESTERONE ACETATE 150 MG/ML IM SUSP: INTRAMUSCULAR | 3 refills | Status: DC

## 2019-03-19 NOTE — Progress Notes (Signed)
Patient ID: Judith Mcintosh, female   DOB: 12/04/1985, 33 y.o.   MRN: [id number]  Judith Mcintosh is a 33 y.o. [phone] female here for a routine annual gynecologic exam.  Current complaints: none.   Denies abnormal vaginal bleeding, discharge, pelvic pain, problems with intercourse or other gynecologic concerns.    Gynecologic History No LMP recorded. Patient has had an injection. Contraception: Depo-Provera injections Last Pap: 2018. Results were: normal Last mammogram: NA.   Obstetric History OB History  Gravida Para Term Preterm AB Living  4 4 3 1   4   SAB TAB Ectopic Multiple Live Births  0 0 0 0 4    # Outcome Date GA Lbr Len/2nd Weight Sex Delivery Anes PTL Lv  4 Term 10/28/14 [redacted]w[redacted]d 03:31 / 00:39 7 lb 12.5 oz (3.53 kg) F Vag-Spont EPI  LIV     Birth Comments: WNL  3 Preterm 04/21/11 [redacted]w[redacted]d 11:13 / 00:35 6 lb 5.8 oz (2.886 kg) F Vag-Spont EPI  LIV  2 Term      Vag-Spont   LIV  1 Term      Vag-Spont   LIV    Past Medical History:  Diagnosis Date  . Abnormal Pap smear 2005   LEEP  . Headache(784.0) Migraine  . Irritable bowel syndrome (IBS)     Past Surgical History:  Procedure Laterality Date  . LEEP  2005  . NO PAST SURGERIES      No current outpatient medications on file prior to visit.   No current facility-administered medications on file prior to visit.     No Known Allergies  Social History   Socioeconomic History  . Marital status: Single    Spouse name: Not on file  . Number of children: Not on file  . Years of education: Not on file  . Highest education level: Not on file  Occupational History  . Not on file  Social Needs  . Financial resource strain: Not on file  . Food insecurity    Worry: Not on file    Inability: Not on file  . Transportation needs    Medical: Not on file    Non-medical: Not on file  Tobacco Use  . Smoking status: Never Smoker  . Smokeless tobacco: Never Used  Substance and Sexual Activity  . Alcohol use: No   Alcohol/week: 0.0 standard drinks  . Drug use: No  . Sexual activity: Yes    Partners: Male    Birth control/protection: Injection  Lifestyle  . Physical activity    Days per week: Not on file    Minutes per session: Not on file  . Stress: Not on file  Relationships  . Social Herbalist on phone: Not on file    Gets together: Not on file    Attends religious service: Not on file    Active member of club or organization: Not on file    Attends meetings of clubs or organizations: Not on file    Relationship status: Not on file  . Intimate partner violence    Fear of current or ex partner: Not on file    Emotionally abused: Not on file    Physically abused: Not on file    Forced sexual activity: Not on file  Other Topics Concern  . Not on file  Social History Narrative  . Not on file    Family History  Problem Relation Age of Onset  . Hypertension Mother  The following portions of the patient's history were reviewed and updated as appropriate: allergies, current medications, past family history, past medical history, past social history, past surgical history and problem list.  Review of Systems Pertinent items noted in HPI and remainder of comprehensive ROS otherwise negative.   Objective:  BP (!) 141/94   Pulse (!) 121   Ht 4\' 8"  (1.422 m)   Wt 165 lb (74.8 kg)   BMI 36.99 kg/m  CONSTITUTIONAL: Well-developed, well-nourished female in no acute distress.  HENT:  Normocephalic, atraumatic, External right and left ear normal. Oropharynx is clear and moist EYES: Conjunctivae and EOM are normal. Pupils are equal, round, and reactive to light. No scleral icterus.  NECK: Normal range of motion, supple, no masses.  Normal thyroid.  SKIN: Skin is warm and dry. No rash noted. Not diaphoretic. No erythema. No pallor. NEUROLGIC: Alert and oriented to person, place, and time. Normal reflexes, muscle tone coordination. No cranial nerve deficit noted. PSYCHIATRIC:  Normal mood and affect. Normal behavior. Normal judgment and thought content. CARDIOVASCULAR: Normal heart rate noted, regular rhythm RESPIRATORY: Clear to auscultation bilaterally. Effort and breath sounds normal, no problems with respiration noted. BREASTS: Symmetric in size. No masses, skin changes, nipple drainage, or lymphadenopathy. ABDOMEN: Soft, normal bowel sounds, no distention noted.  No tenderness, rebound or guarding.  PELVIC: Normal appearing external genitalia; normal appearing vaginal mucosa and cervix.  No abnormal discharge noted.  Pap smear not obtained.  Normal uterine size, no other palpable masses, no uterine or adnexal tenderness. MUSCULOSKELETAL: Normal range of motion. No tenderness.  No cyanosis, clubbing, or edema.  2+ distal pulses.   Assessment:  Annual gynecologic examination without pap smear ( not indicated) Depo Provera contraception Plan:  Pap smear next yr. Declined STD testing. Continue with Depo Provera q 12 weeks. Rx to pharmacy Routine preventative health maintenance measures emphasized. Please refer to After Visit Summary for other counseling recommendations.    , MD, FACOG Attending Obstetrician & Gynecologist Center for [facility], [facility]

## 2019-03-19 NOTE — Progress Notes (Signed)
Patient presents for Annual Exam .  LMP:No periods W/Depo  Last Pap: 07/20/2016 WNL  Contraception: Depo Last Depo Given 12/31/18 STD Screening: Declines   CC: None

## 2019-03-19 NOTE — Patient Instructions (Signed)
Health Maintenance, Female Adopting a healthy lifestyle and getting preventive care are important in promoting health and wellness. Ask your health care provider about:  The right schedule for you to have regular tests and exams.  Things you can do on your own to prevent diseases and keep yourself healthy. What should I know about diet, weight, and exercise? Eat a healthy diet   Eat a diet that includes plenty of vegetables, fruits, low-fat dairy products, and lean protein.  Do not eat a lot of foods that are high in solid fats, added sugars, or sodium. Maintain a healthy weight Body mass index (BMI) is used to identify weight problems. It estimates body fat based on height and weight. Your health care provider can help determine your BMI and help you achieve or maintain a healthy weight. Get regular exercise Get regular exercise. This is one of the most important things you can do for your health. Most adults should:  Exercise for at least 150 minutes each week. The exercise should increase your heart rate and make you sweat (moderate-intensity exercise).  Do strengthening exercises at least twice a week. This is in addition to the moderate-intensity exercise.  Spend less time sitting. Even light physical activity can be beneficial. Watch cholesterol and blood lipids Have your blood tested for lipids and cholesterol at 33 years of age, then have this test every 5 years. Have your cholesterol levels checked more often if:  Your lipid or cholesterol levels are high.  You are older than 33 years of age.  You are at high risk for heart disease. What should I know about cancer screening? Depending on your health history and family history, you may need to have cancer screening at various ages. This may include screening for:  Breast cancer.  Cervical cancer.  Colorectal cancer.  Skin cancer.  Lung cancer. What should I know about heart disease, diabetes, and high blood  pressure? Blood pressure and heart disease  High blood pressure causes heart disease and increases the risk of stroke. This is more likely to develop in people who have high blood pressure readings, are of African descent, or are overweight.  Have your blood pressure checked: ? Every 3-5 years if you are 18-39 years of age. ? Every year if you are 40 years old or older. Diabetes Have regular diabetes screenings. This checks your fasting blood sugar level. Have the screening done:  Once every three years after age 40 if you are at a normal weight and have a low risk for diabetes.  More often and at a younger age if you are overweight or have a high risk for diabetes. What should I know about preventing infection? Hepatitis B If you have a higher risk for hepatitis B, you should be screened for this virus. Talk with your health care provider to find out if you are at risk for hepatitis B infection. Hepatitis C Testing is recommended for:  Everyone born from 1945 through 1965.  Anyone with known risk factors for hepatitis C. Sexually transmitted infections (STIs)  Get screened for STIs, including gonorrhea and chlamydia, if: ? You are sexually active and are younger than 33 years of age. ? You are older than 33 years of age and your health care provider tells you that you are at risk for this type of infection. ? Your sexual activity has changed since you were last screened, and you are at increased risk for chlamydia or gonorrhea. Ask your health care provider if   you are at risk.  Ask your health care provider about whether you are at high risk for HIV. Your health care provider may recommend a prescription medicine to help prevent HIV infection. If you choose to take medicine to prevent HIV, you should first get tested for HIV. You should then be tested every 3 months for as long as you are taking the medicine. Pregnancy  If you are about to stop having your period (premenopausal) and  you may become pregnant, seek counseling before you get pregnant.  Take 400 to 800 micrograms (mcg) of folic acid every day if you become pregnant.  Ask for birth control (contraception) if you want to prevent pregnancy. Osteoporosis and menopause Osteoporosis is a disease in which the bones lose minerals and strength with aging. This can result in bone fractures. If you are 65 years old or older, or if you are at risk for osteoporosis and fractures, ask your health care provider if you should:  Be screened for bone loss.  Take a calcium or vitamin D supplement to lower your risk of fractures.  Be given hormone replacement therapy (HRT) to treat symptoms of menopause. Follow these instructions at home: Lifestyle  Do not use any products that contain nicotine or tobacco, such as cigarettes, e-cigarettes, and chewing tobacco. If you need help quitting, ask your health care provider.  Do not use street drugs.  Do not share needles.  Ask your health care provider for help if you need support or information about quitting drugs. Alcohol use  Do not drink alcohol if: ? Your health care provider tells you not to drink. ? You are pregnant, may be pregnant, or are planning to become pregnant.  If you drink alcohol: ? Limit how much you use to 0-1 drink a day. ? Limit intake if you are breastfeeding.  Be aware of how much alcohol is in your drink. In the [place] one drink equals one 12 oz bottle of beer (355 mL), one 5 oz glass of wine (148 mL), or one 1 oz glass of hard liquor (44 mL). General instructions  Schedule regular health, dental, and eye exams.  Stay current with your vaccines.  Tell your health care provider if: ? You often feel depressed. ? You have ever been abused or do not feel safe at home. Summary  Adopting a healthy lifestyle and getting preventive care are important in promoting health and wellness.  Follow your health care provider's instructions about healthy  diet, exercising, and getting tested or screened for diseases.  Follow your health care provider's instructions on monitoring your cholesterol and blood pressure. This information is not intended to replace advice given to you by your health care provider. Make sure you discuss any questions you have with your health care provider. Document Released: 12/20/2010 Document Revised: 05/30/2018 Document Reviewed: 05/30/2018 Elsevier Patient Education  2020 Elsevier Inc.  

## 2019-04-01 IMAGING — DX DG ANKLE COMPLETE 3+V*R*
3 series · 3 of 3 positions shown · non-contrast
Comparison: None.

CLINICAL DATA: Fall with bilateral ankle pain.  Initial encounter.

EXAM:
RIGHT ANKLE - COMPLETE 3+ VIEW

[ankle ap]
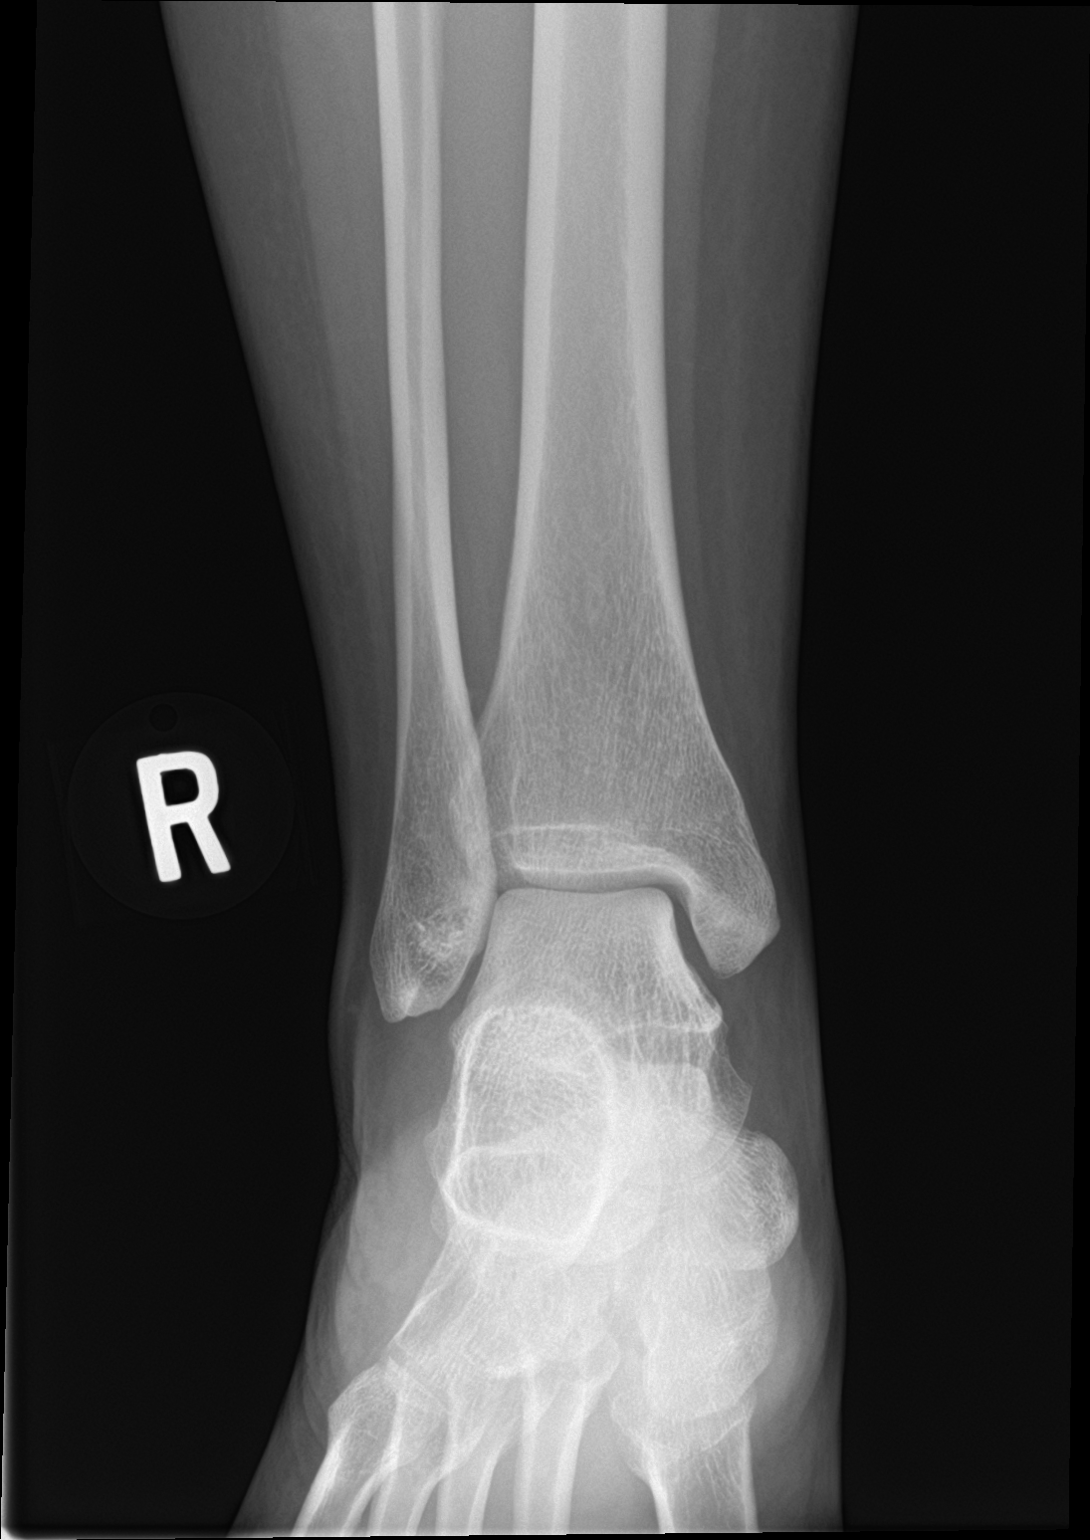

[ankle obl]
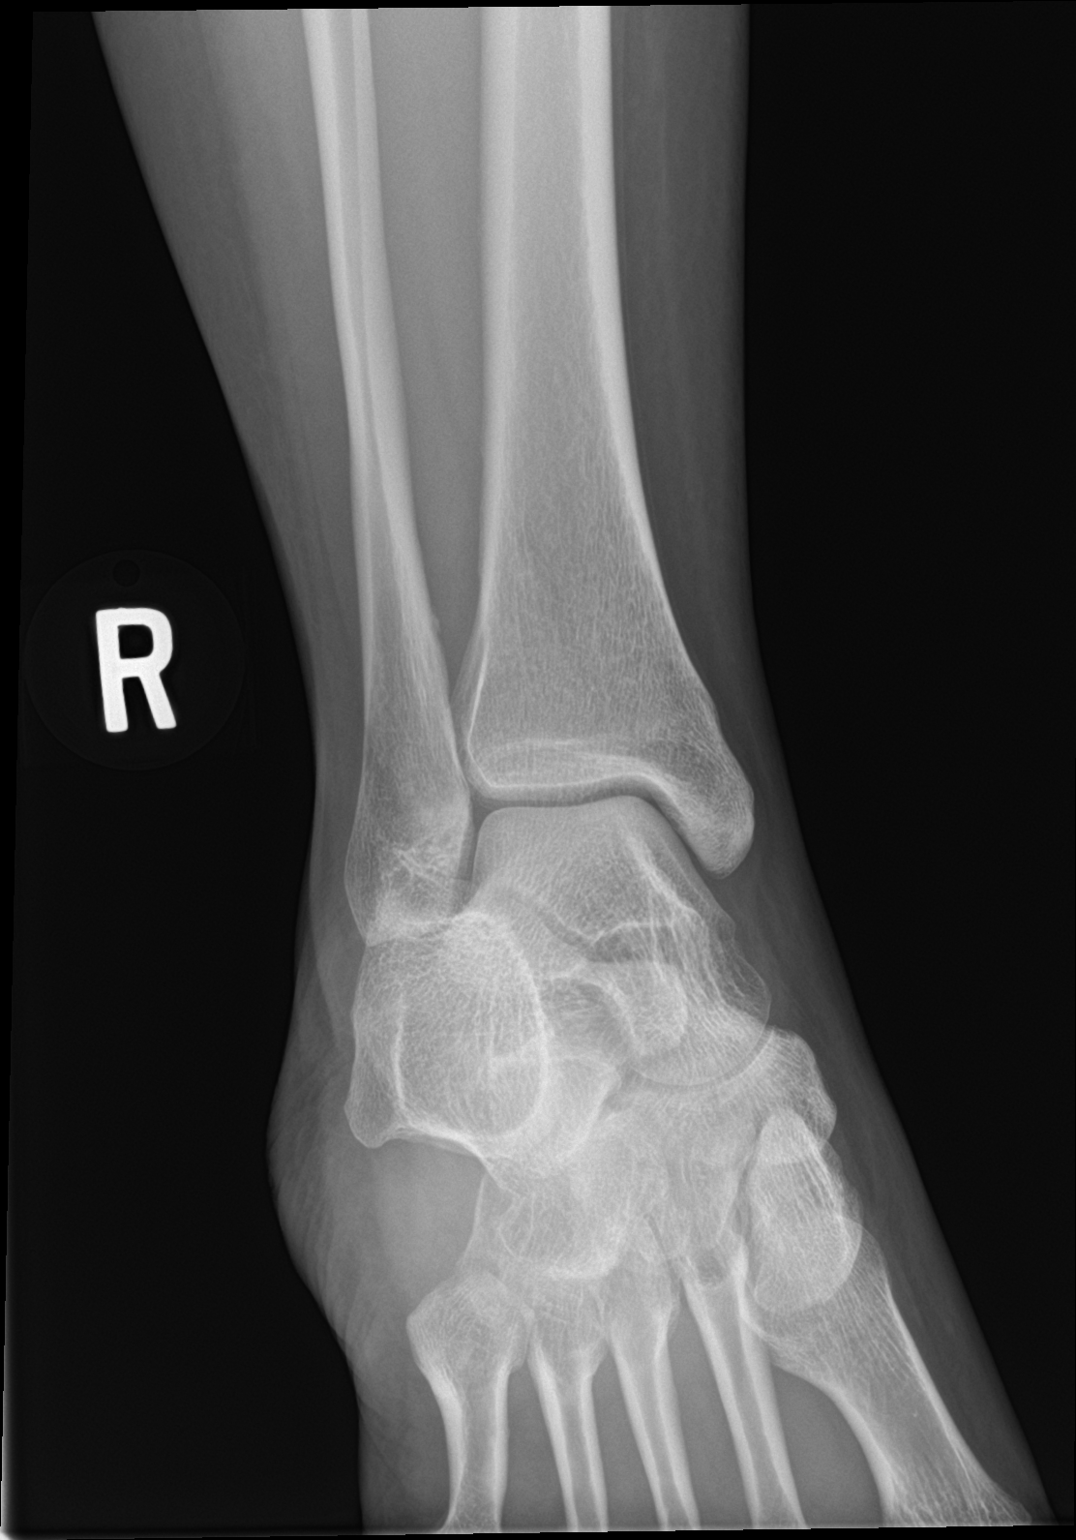

[ankle lat]
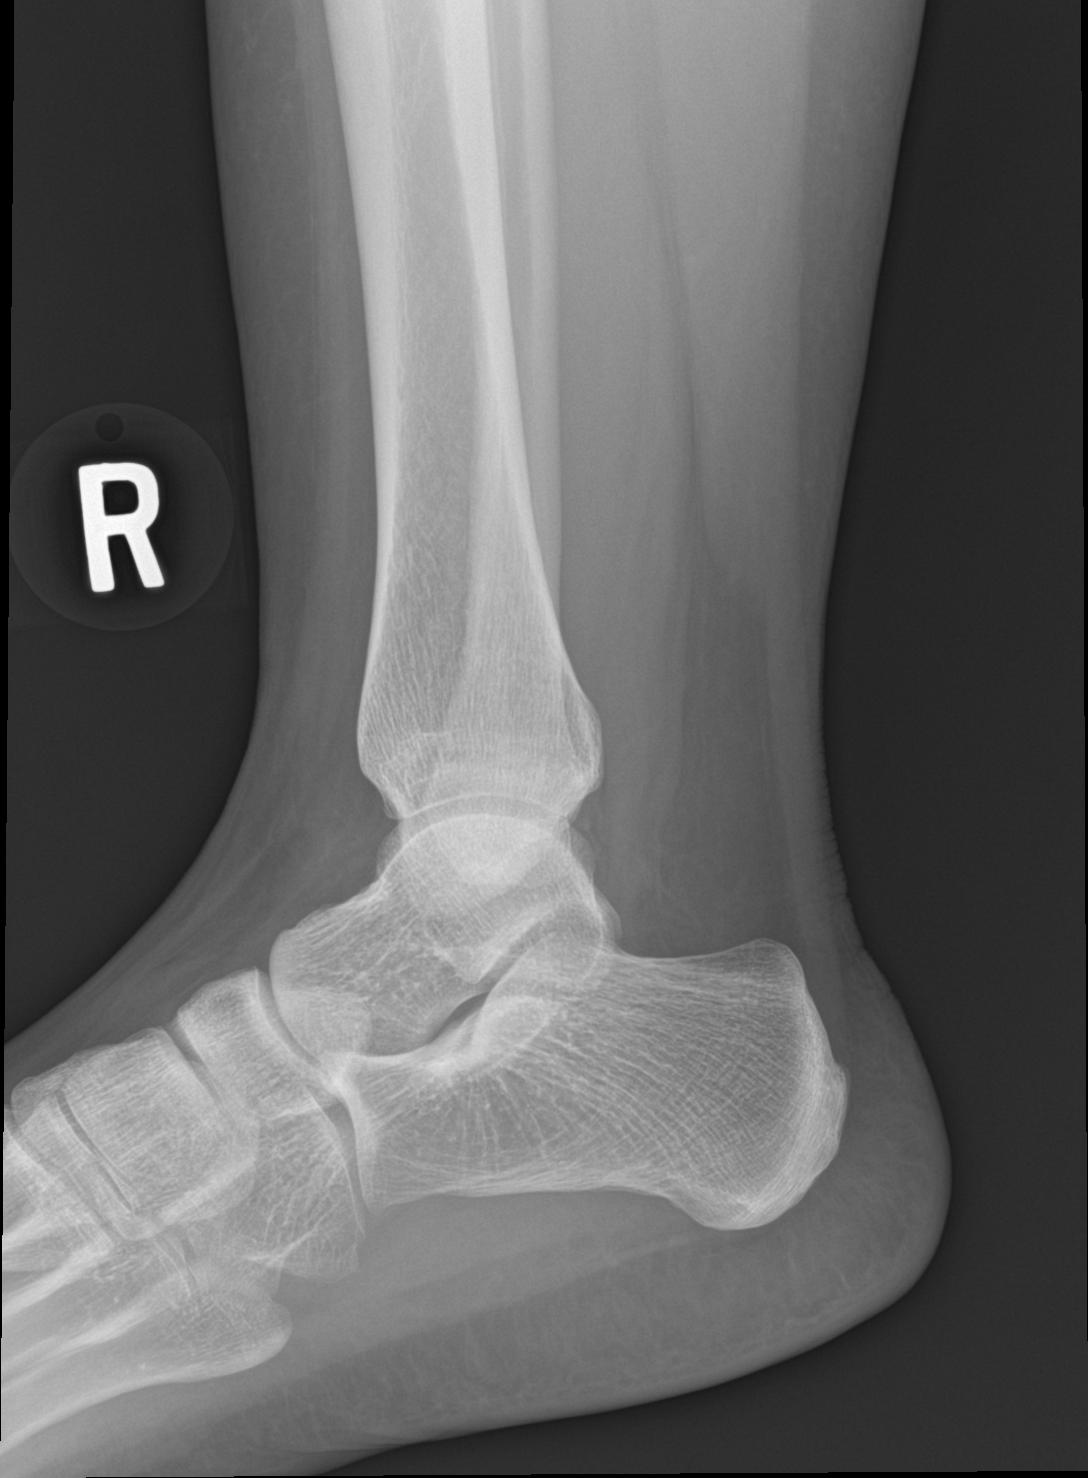

[3 of 3 positions shown; findings below may reference images not displayed]

FINDINGS: There is no evidence of fracture, dislocation, or joint effusion.
There is no evidence of arthropathy or other focal bone abnormality.
Soft tissues are unremarkable.
IMPRESSION: Negative.

## 2019-04-01 IMAGING — DX DG ANKLE COMPLETE 3+V*L*
3 series · 3 of 3 positions shown · non-contrast
Comparison: None.

CLINICAL DATA: Fall with bilateral ankle pain.  Initial encounter.

EXAM:
LEFT ANKLE COMPLETE - 3+ VIEW

[ankle ap]
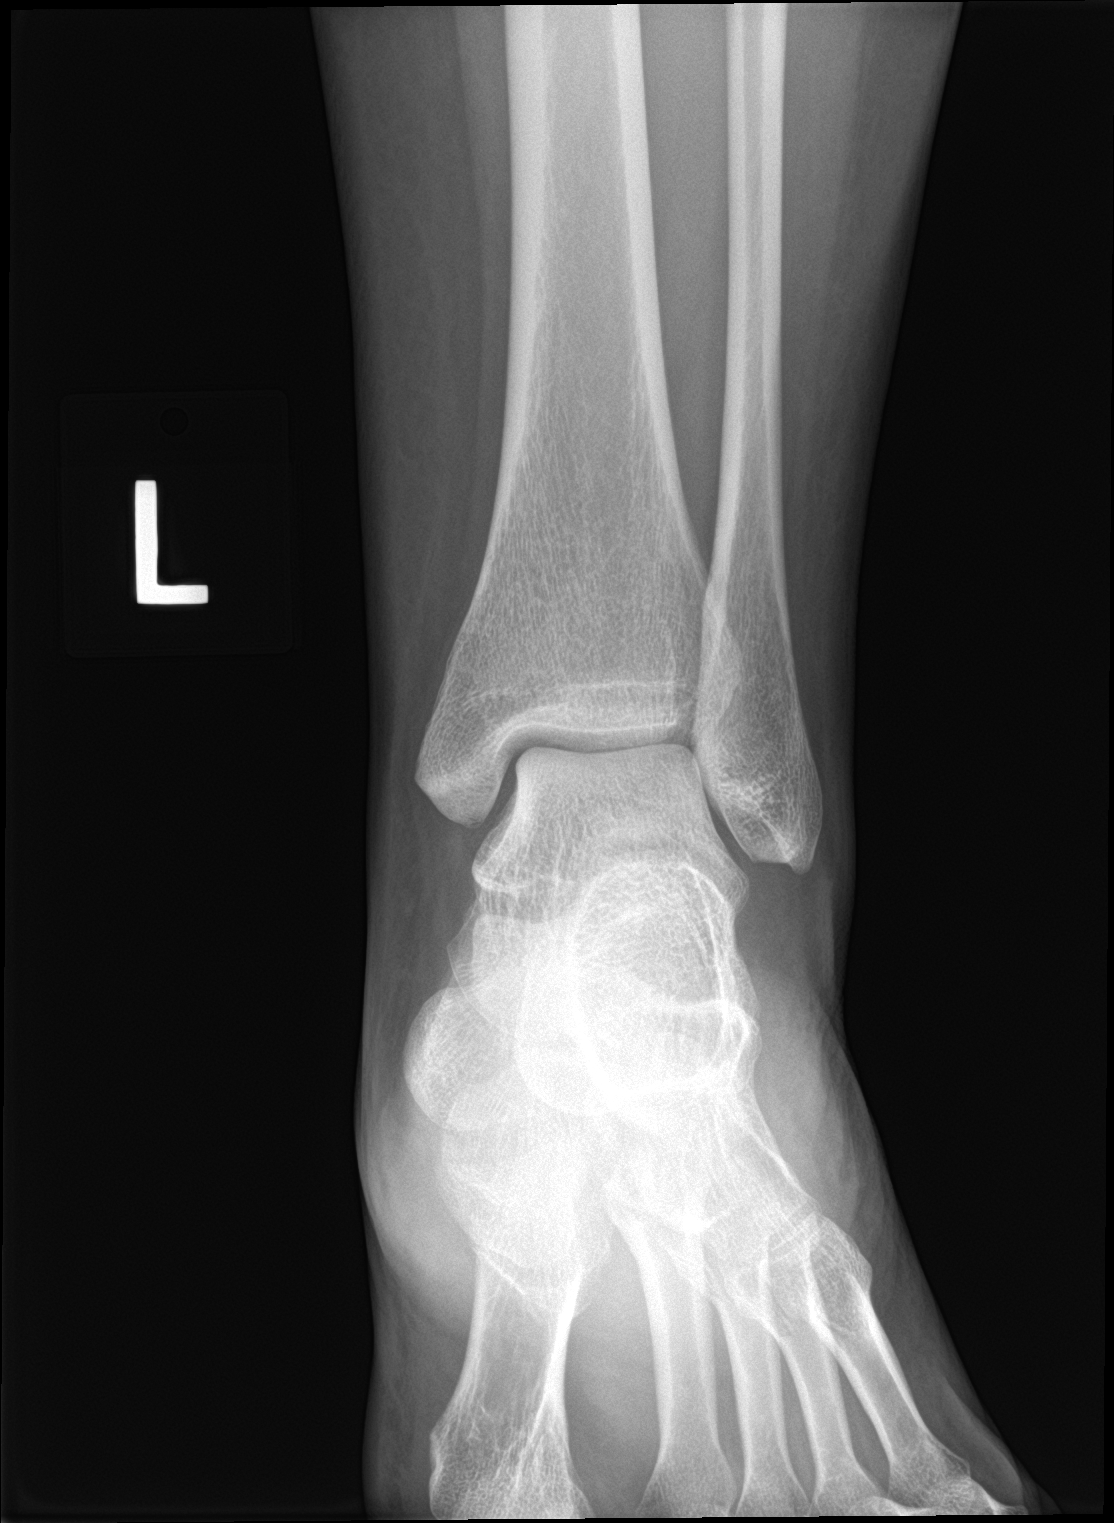

[ankle obl]
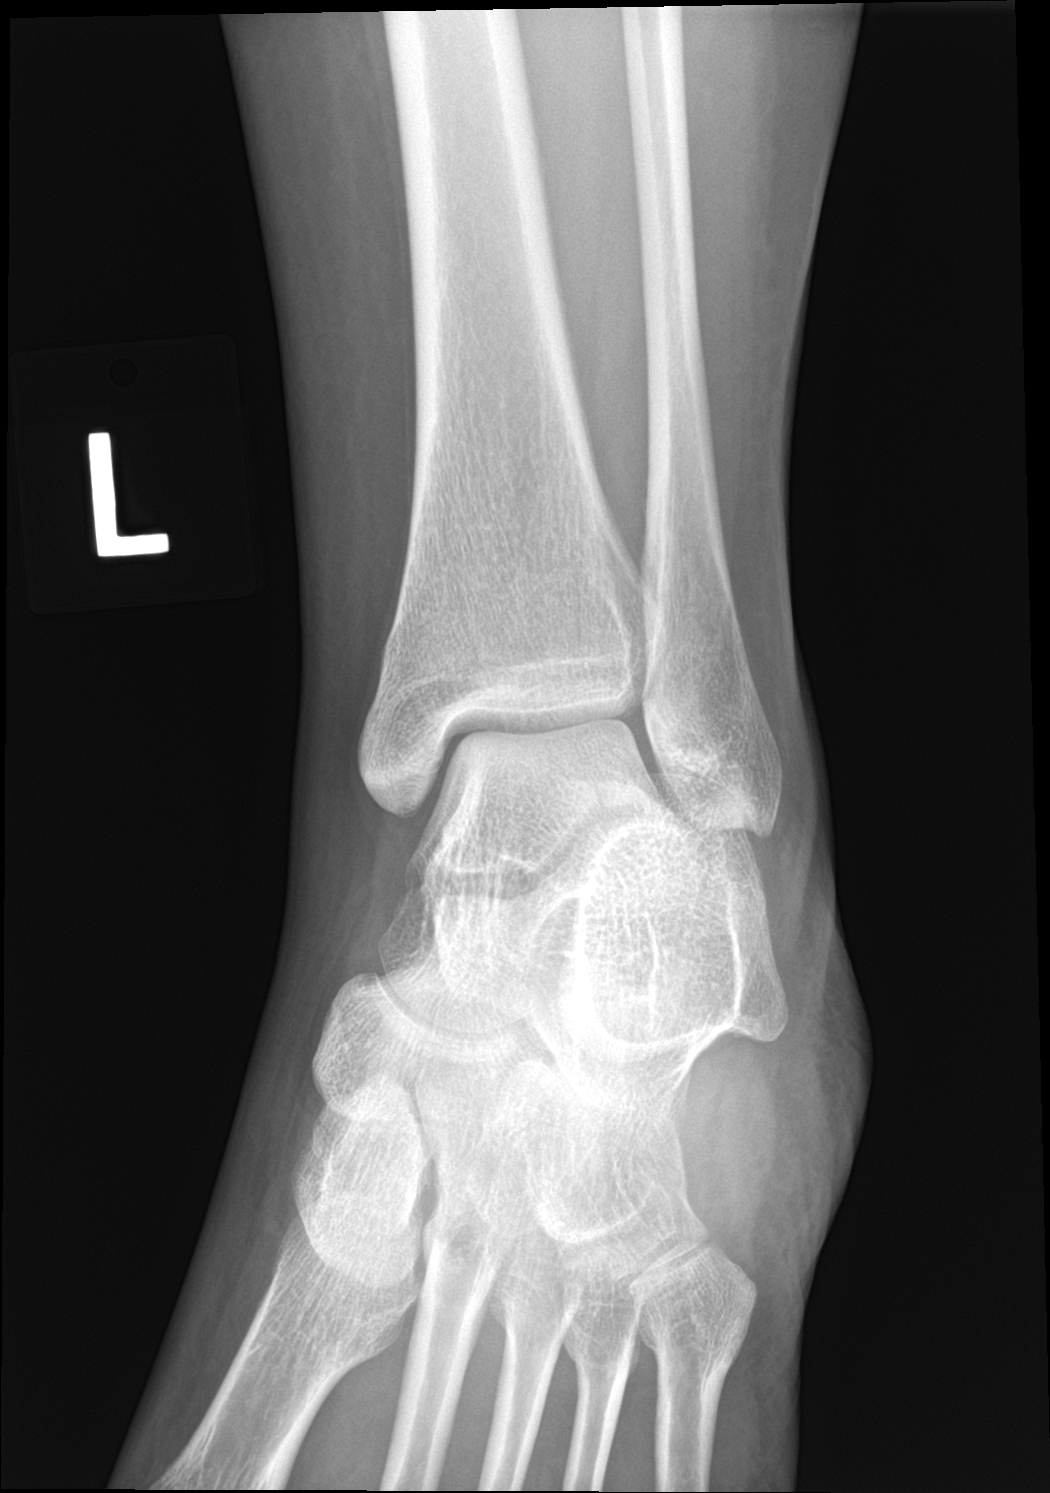

[ankle lat]
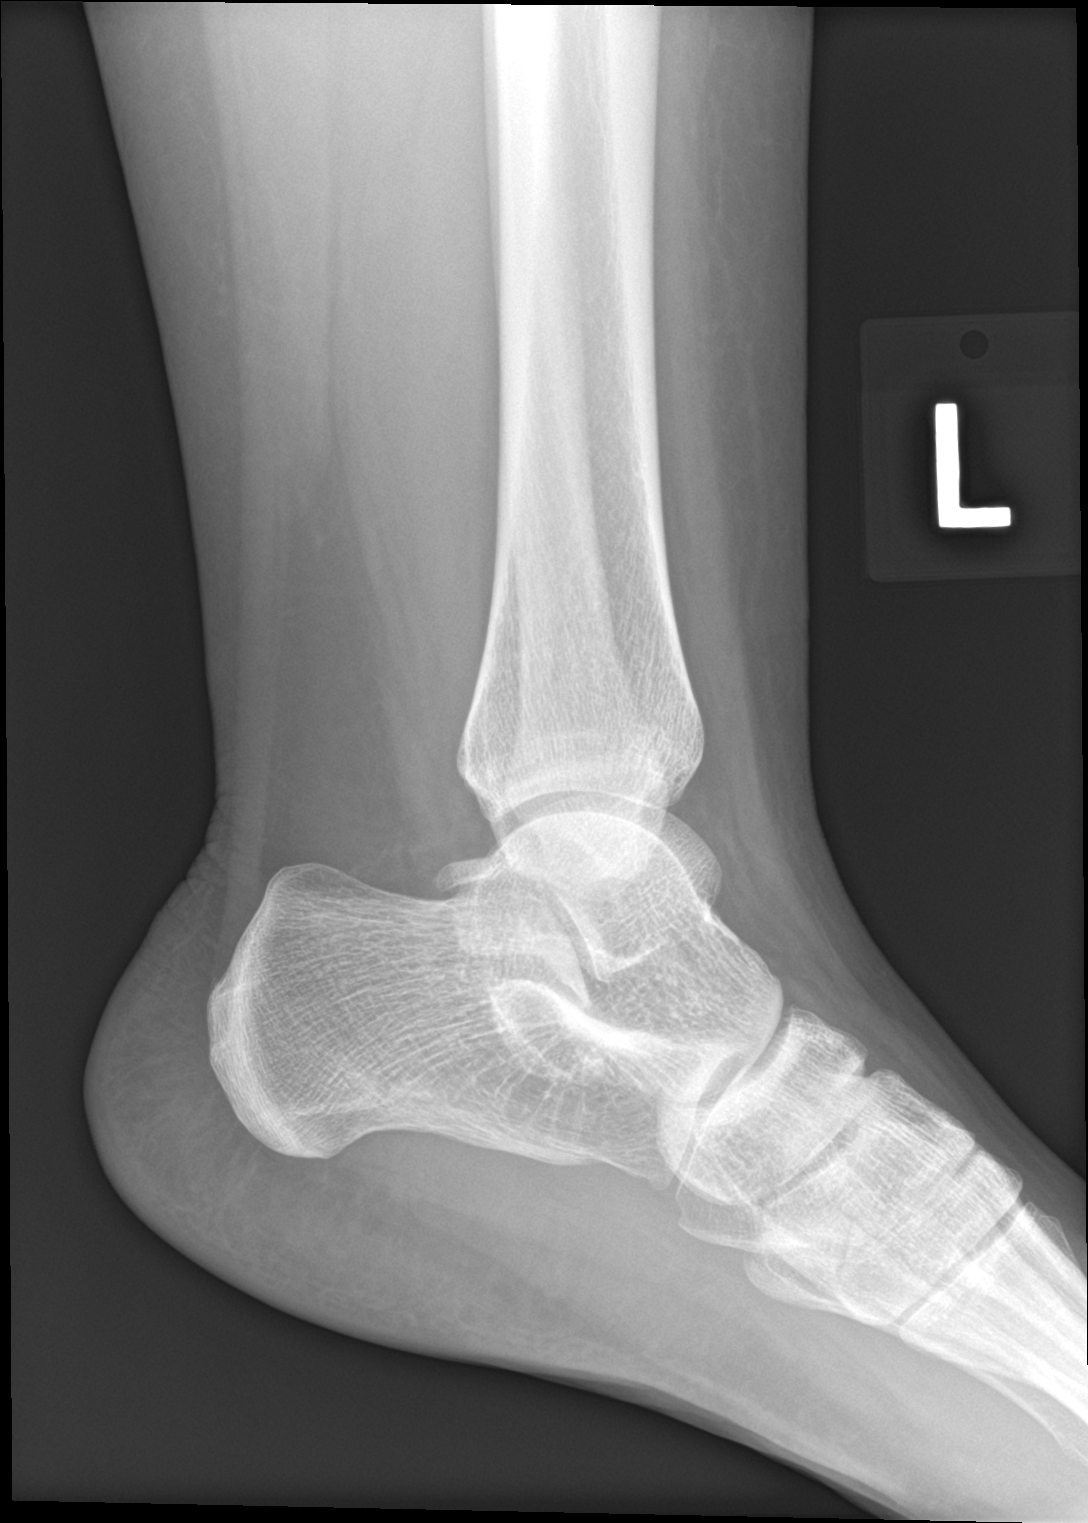

[3 of 3 positions shown; findings below may reference images not displayed]

FINDINGS: There is no evidence of fracture, dislocation, or joint effusion.
There is no evidence of arthropathy or other focal bone abnormality.
Soft tissues are unremarkable.
IMPRESSION: Negative.

## 2019-06-10 ENCOUNTER — Ambulatory Visit (INDEPENDENT_AMBULATORY_CARE_PROVIDER_SITE_OTHER): Payer: Medicaid Other

## 2019-06-10 ENCOUNTER — Other Ambulatory Visit: Payer: Self-pay

## 2019-06-10 DIAGNOSIS — Z3042 Encounter for surveillance of injectable contraceptive: Secondary | ICD-10-CM | POA: Diagnosis not present

## 2019-06-10 MED ORDER — MEDROXYPROGESTERONE ACETATE 150 MG/ML IM SUSP
150.0000 mg | INTRAMUSCULAR | Status: DC
  Administered 2019-06-10 – 2020-05-08 (×5): 150 mg via INTRAMUSCULAR

## 2019-06-10 NOTE — Progress Notes (Signed)
Pt is in the office for depo injection, administered in [place] and pt tolerated well. Marland Kitchen.Next due Mar 8-22 Administrations This Visit    medroxyPROGESTERone (DEPO-PROVERA) injection 150 mg    Admin Date 06/10/2019 Action Given Dose 150 mg Route Intramuscular Administered By Hinton Lovely, RN

## 2019-06-10 NOTE — Progress Notes (Signed)
Patient seen and assessed by nursing staff during this encounter. I have reviewed the chart and agree with the documentation and plan.  Mora Bellman, MD 06/10/2019 11:47 AM

## 2019-07-16 DIAGNOSIS — K011 Impacted teeth: Secondary | ICD-10-CM | POA: Diagnosis not present

## 2019-07-16 DIAGNOSIS — K029 Dental caries, unspecified: Secondary | ICD-10-CM | POA: Diagnosis not present

## 2019-09-02 ENCOUNTER — Ambulatory Visit (INDEPENDENT_AMBULATORY_CARE_PROVIDER_SITE_OTHER): Payer: Medicaid Other | Admitting: *Deleted

## 2019-09-02 ENCOUNTER — Other Ambulatory Visit: Payer: Self-pay

## 2019-09-02 VITALS — BP 152/94 | HR 139 | Ht 68.0 in | Wt 153.3 lb

## 2019-09-02 DIAGNOSIS — Z3042 Encounter for surveillance of injectable contraceptive: Secondary | ICD-10-CM | POA: Diagnosis not present

## 2019-09-02 NOTE — Progress Notes (Signed)
   Subjective:  Pt in for Depo Provera injection.    Objective: Need for contraception. No unusual complaints.   Today's Vitals   09/02/19 1020 09/02/19 1030  BP: (!) 154/100 (!) 152/94  Pulse: (!) 134 (!) 139  Weight: 153 lb 4.8 oz (69.5 kg)   Height: 5\' 8"  (1.727 m)   PainSc: 0-No pain     Assessment: Pt tolerated Depo injection. Depo given Right upper outer quadrant. Patient denies headaches, dizziness, visual changes, SOB, chest pain, n/v and abdominal pain. Patient reported she was very scared getting on the elevator and whenever she comes to doctor appointment; she get very nervous.  Plan:  Next injection due May 31-December 02, 2019.  Advised patient to contact a primary care provider regarding high blood pressure. Number to [facility] Medicine given.  [facility], RN

## 2019-11-25 ENCOUNTER — Other Ambulatory Visit: Payer: Self-pay

## 2019-11-25 ENCOUNTER — Ambulatory Visit (INDEPENDENT_AMBULATORY_CARE_PROVIDER_SITE_OTHER): Payer: Medicaid Other

## 2019-11-25 VITALS — BP 150/80 | HR 72

## 2019-11-25 DIAGNOSIS — Z3042 Encounter for surveillance of injectable contraceptive: Secondary | ICD-10-CM | POA: Diagnosis not present

## 2019-11-25 NOTE — Progress Notes (Signed)
Date last pap: 07/20/2016. Last Depo-Provera: 09/02/2019. Side Effects if any: Patient denies any side effects at this time.  Serum HCG indicated? Not indicted Depo-Provera: 150 mg IM given in left gluteal by: D.Cheree Ditto Next appointment due 02/16/2020  Patient has been advised to follow up with annual exam since last pap was three years ago. Advised patient to contact her pcp for follow-up care regarding her blood pressure. Patient voice understanding.

## 2020-02-13 ENCOUNTER — Ambulatory Visit: Payer: Medicaid Other

## 2020-02-17 ENCOUNTER — Ambulatory Visit: Payer: Medicaid Other

## 2020-02-17 ENCOUNTER — Telehealth: Payer: Self-pay | Admitting: *Deleted

## 2020-02-17 NOTE — Telephone Encounter (Signed)
If she is OK to wait to her annual that will be fine. Judith Mcintosh

## 2020-02-17 NOTE — Telephone Encounter (Signed)
Pt called to office stating she would like to change [facility] and try a pill instead of Depo.  Pt does not have PCP and has elevated BP the last few visits.  Pt advised that she may not be able to start a pill due to this. Pt advised she may need a contraception visit to discuss options if she wants to come off Depo. Pt made aware that message would be sent to provider for recommendation.  Pt is due for AEX at end of September.       Please advise.

## 2020-02-18 ENCOUNTER — Encounter: Payer: Self-pay | Admitting: *Deleted

## 2020-02-18 NOTE — Telephone Encounter (Signed)
Message sent via my chart

## 2020-02-19 ENCOUNTER — Ambulatory Visit (INDEPENDENT_AMBULATORY_CARE_PROVIDER_SITE_OTHER): Payer: Medicaid Other

## 2020-02-19 ENCOUNTER — Other Ambulatory Visit: Payer: Self-pay

## 2020-02-19 DIAGNOSIS — Z3042 Encounter for surveillance of injectable contraceptive: Secondary | ICD-10-CM | POA: Diagnosis not present

## 2020-02-19 NOTE — Progress Notes (Signed)
I have reviewed this chart and agree with the RN/CMA assessment and management.    K. Meryl Nadirah Socorro, M.D. Attending [facility]   

## 2020-02-19 NOTE — Progress Notes (Signed)
Pt is in the office for depo injection, administered in RUOQ and pt tolerated well. Next due Nov 17- Dec1. .. Administrations This Visit    medroxyPROGESTERone (DEPO-PROVERA) injection 150 mg    Admin Date 02/19/2020 Action Given Dose 150 mg Route Intramuscular Administered By Katrina Stack, RN

## 2020-03-20 ENCOUNTER — Ambulatory Visit: Payer: Medicaid Other | Admitting: Family Medicine

## 2020-04-02 ENCOUNTER — Ambulatory Visit: Payer: Medicaid Other | Admitting: Obstetrics

## 2020-04-09 ENCOUNTER — Ambulatory Visit: Payer: Medicaid Other | Admitting: Obstetrics

## 2020-05-06 ENCOUNTER — Other Ambulatory Visit: Payer: Self-pay

## 2020-05-06 MED ORDER — MEDROXYPROGESTERONE ACETATE 150 MG/ML IM SUSP: 150.0000 mg | INTRAMUSCULAR | 0 refills | Status: DC

## 2020-05-06 NOTE — Telephone Encounter (Signed)
Refill on depo-provera sent to patient pharmacy.

## 2020-05-08 ENCOUNTER — Other Ambulatory Visit: Payer: Self-pay

## 2020-05-08 ENCOUNTER — Encounter: Payer: Self-pay | Admitting: Obstetrics

## 2020-05-08 ENCOUNTER — Ambulatory Visit (INDEPENDENT_AMBULATORY_CARE_PROVIDER_SITE_OTHER): Payer: Medicaid Other | Admitting: Obstetrics

## 2020-05-08 ENCOUNTER — Other Ambulatory Visit (HOSPITAL_COMMUNITY)
Admission: RE | Admit: 2020-05-08 | Discharge: 2020-05-08 | Disposition: A | Discharge status: Home / Self Care | Payer: Medicaid Other | Source: Ambulatory Visit | Attending: Obstetrics | Admitting: Obstetrics

## 2020-05-08 ENCOUNTER — Ambulatory Visit: Payer: Medicaid Other

## 2020-05-08 VITALS — BP 147/100 | HR 90 | Wt 159.0 lb

## 2020-05-08 DIAGNOSIS — I10 Essential (primary) hypertension: Secondary | ICD-10-CM

## 2020-05-08 DIAGNOSIS — N898 Other specified noninflammatory disorders of vagina: Secondary | ICD-10-CM | POA: Diagnosis not present

## 2020-05-08 DIAGNOSIS — N946 Dysmenorrhea, unspecified: Secondary | ICD-10-CM | POA: Diagnosis not present

## 2020-05-08 DIAGNOSIS — Z01419 Encounter for gynecological examination (general) (routine) without abnormal findings: Secondary | ICD-10-CM | POA: Insufficient documentation

## 2020-05-08 DIAGNOSIS — Z3042 Encounter for surveillance of injectable contraceptive: Secondary | ICD-10-CM | POA: Diagnosis not present

## 2020-05-08 MED ORDER — IBUPROFEN 800 MG PO TABS: 800.0000 mg | ORAL_TABLET | Freq: Three times a day (TID) | ORAL | 5 refills | Status: AC | PRN

## 2020-05-08 MED ORDER — AMLODIPINE BESYLATE 5 MG PO TABS: 5.0000 mg | ORAL_TABLET | Freq: Every day | ORAL | 11 refills | Status: DC

## 2020-05-08 MED ORDER — TRIAMTERENE-HCTZ 37.5-25 MG PO CAPS: 1.0000 | ORAL_CAPSULE | Freq: Every day | ORAL | 11 refills | Status: DC

## 2020-05-08 NOTE — Progress Notes (Signed)
Subjective:        Judith Mcintosh is a 34 y.o. female here for a routine exam.  Current complaints: Vaginal discharge.    Personal health questionnaire:  Is patient Ashkenazi Jewish, have a family history of breast and/or ovarian cancer: no Is there a family history of uterine cancer diagnosed at age < 106, gastrointestinal cancer, urinary tract cancer, family member who is a Personnel officer syndrome-associated carrier: no Is the patient overweight and hypertensive, family history of diabetes, personal history of gestational diabetes, preeclampsia or PCOS: no Is patient over 57, have PCOS,  family history of premature CHD under age 81, diabetes, smoke, have hypertension or peripheral artery disease:  no At any time, has a partner hit, kicked or otherwise hurt or frightened you?: no Over the past 2 weeks, have you felt down, depressed or hopeless?: no Over the past 2 weeks, have you felt little interest or pleasure in doing things?:no   Gynecologic History No LMP recorded. Patient has had an injection. Contraception: Depo-Provera injections Last Pap: 07-20-2016. Results were: normal Last mammogram: n/a. Results were: n/a  Obstetric History OB History  Gravida Para Term Preterm AB Living  4 4 3 1   4   SAB TAB Ectopic Multiple Live Births  0 0 0 0 4    # Outcome Date GA Lbr Len/2nd Weight Sex Delivery Anes PTL Lv  4 Term 10/28/14 [redacted]w[redacted]d 03:31 / 00:39 7 lb 12.5 oz (3.53 kg) F Vag-Spont EPI  LIV     Birth Comments: WNL  3 Preterm 04/21/11 [redacted]w[redacted]d 11:13 / 00:35 6 lb 5.8 oz (2.886 kg) F Vag-Spont EPI  LIV  2 Term      Vag-Spont   LIV  1 Term      Vag-Spont   LIV    Past Medical History:  Diagnosis Date  . Abnormal Pap smear 2005   LEEP  . Headache(784.0) Migraine  . Irritable bowel syndrome (IBS)     Past Surgical History:  Procedure Laterality Date  . LEEP  2005  . NO PAST SURGERIES       Current Outpatient Medications:  .  medroxyPROGESTERone (DEPO-PROVERA) 150 MG/ML injection,  INJECT INTO MUSCLE EVERY 3 MONTHS, Disp: 1 mL, Rfl: 3 .  medroxyPROGESTERone (DEPO-PROVERA) 150 MG/ML injection, Inject 1 mL (150 mg total) into the muscle every 3 (three) months. (Patient not taking: Reported on 05/08/2020), Disp: 1 mL, Rfl: 0  Current Facility-Administered Medications:  .  medroxyPROGESTERone (DEPO-PROVERA) injection 150 mg, 150 mg, Intramuscular, Q90 days, Constant, Peggy, MD, 150 mg at 02/19/20 1030 No Known Allergies  Social History   Tobacco Use  . Smoking status: Never Smoker  . Smokeless tobacco: Never Used  Substance Use Topics  . Alcohol use: No    Alcohol/week: 0.0 standard drinks    Family History  Problem Relation Age of Onset  . Hypertension Mother       Review of Systems  Constitutional: negative for fatigue and weight loss Respiratory: negative for cough and wheezing Cardiovascular: negative for chest pain, fatigue and palpitations Gastrointestinal: negative for abdominal pain and change in bowel habits Musculoskeletal:negative for myalgias Neurological: negative for gait problems and tremors Behavioral/Psych: negative for abusive relationship, depression Endocrine: negative for temperature intolerance    Genitourinary:negative for abnormal menstrual periods, genital lesions, hot flashes, sexual problems.  Positive for vaginal discharge Integument/breast: negative for breast lump, breast tenderness, nipple discharge and skin lesion(s)    Objective:       BP (!) 147/100  Pulse 90   Wt 159 lb (72.1 kg)   BMI 24.18 kg/m  General:   alert  Skin:   no rash or abnormalities  Lungs:   clear to auscultation bilaterally  Heart:   regular rate and rhythm, S1, S2 normal, no murmur, click, rub or gallop  Breasts:   normal without suspicious masses, skin or nipple changes or axillary nodes  Abdomen:  normal findings: no organomegaly, soft, non-tender and no hernia  Pelvis:  External genitalia: normal general appearance Urinary system: urethral  meatus normal and bladder without fullness, nontender Vaginal: normal without tenderness, induration or masses Cervix: normal appearance Adnexa: normal bimanual exam Uterus: anteverted and non-tender, normal size   Lab Review Urine pregnancy test Labs reviewed yes Radiologic studies reviewed no  50% of 20 min visit spent on counseling and coordination of care.   Assessment:     1. Encounter for gynecological examination with Papanicolaou smear of cervix Rx: - Cytology - PAP( [place])  2. Vaginal discharge Rx: - Cervicovaginal ancillary only( [place])  3. Encounter for surveillance of injectable contraceptive - doing well  4. HTN (hypertension), benign Rx: - amLODipine (NORVASC) 5 MG tablet; Take 1 tablet (5 mg total) by mouth daily.  Dispense: 30 tablet; Refill: 11 - triamterene-hydrochlorothiazide (DYAZIDE) 37.5-25 MG capsule; Take 1 each (1 capsule total) by mouth daily.  Dispense: 30 capsule; Refill: 11 - Ambulatory referral to Internal Medicine  5. Dysmenorrhea Rx: - ibuprofen (ADVIL) 800 MG tablet; Take 1 tablet (800 mg total) by mouth every 8 (eight) hours as needed.  Dispense: 30 tablet; Refill: 5   Plan:    Education reviewed: calcium supplements, depression evaluation, low fat, low cholesterol diet, safe sex/STD prevention, self breast exams and weight bearing exercise. Contraception: Depo-Provera injections. Follow up in: 1 year.      Brock Bad, MD 05/08/2020 10:08 AM

## 2020-05-08 NOTE — Progress Notes (Signed)
Pt presents for annual and pap. Pt declined STD blood work  Depo injection given LUOQ without difficulty

## 2020-05-11 LAB — CERVICOVAGINAL ANCILLARY ONLY
Bacterial Vaginitis (gardnerella): NEGATIVE
Candida Glabrata: NEGATIVE
Candida Vaginitis: NEGATIVE
Chlamydia: NEGATIVE
Comment: NEGATIVE
Comment: NEGATIVE
Comment: NEGATIVE
Comment: NEGATIVE
Comment: NEGATIVE
Comment: NORMAL
Neisseria Gonorrhea: NEGATIVE
Trichomonas: NEGATIVE

## 2020-05-12 LAB — CYTOLOGY - PAP
Comment: NEGATIVE
Diagnosis: NEGATIVE
High risk HPV: NEGATIVE

## 2020-07-21 ENCOUNTER — Other Ambulatory Visit: Payer: Self-pay | Admitting: Obstetrics

## 2020-07-28 ENCOUNTER — Ambulatory Visit: Payer: Medicaid Other

## 2020-07-29 ENCOUNTER — Ambulatory Visit: Payer: Medicaid Other

## 2020-08-04 ENCOUNTER — Other Ambulatory Visit: Payer: Self-pay

## 2020-08-04 ENCOUNTER — Ambulatory Visit (INDEPENDENT_AMBULATORY_CARE_PROVIDER_SITE_OTHER): Payer: Medicaid Other

## 2020-08-04 VITALS — BP 139/87 | HR 121 | Ht <= 58 in | Wt 170.0 lb

## 2020-08-04 DIAGNOSIS — Z3042 Encounter for surveillance of injectable contraceptive: Secondary | ICD-10-CM

## 2020-08-04 MED ORDER — MEDROXYPROGESTERONE ACETATE 150 MG/ML IM SUSP
150.0000 mg | Freq: Once | INTRAMUSCULAR | Status: AC
  Administered 2020-08-04: 150 mg via INTRAMUSCULAR

## 2020-08-04 NOTE — Progress Notes (Signed)
Pt presents today for Depo Provera injection. Depo Provera 150mg  given IM RUOQ. Pt tolerated well with no adverse side effects noted. Pt is up to date on yearly exam and not due until 05/09/21 or after.

## 2020-10-19 ENCOUNTER — Other Ambulatory Visit: Payer: Self-pay | Admitting: Obstetrics

## 2020-10-19 DIAGNOSIS — Z3009 Encounter for other general counseling and advice on contraception: Secondary | ICD-10-CM

## 2020-10-27 ENCOUNTER — Other Ambulatory Visit: Payer: Self-pay

## 2020-10-27 ENCOUNTER — Ambulatory Visit (INDEPENDENT_AMBULATORY_CARE_PROVIDER_SITE_OTHER): Payer: Medicaid Other

## 2020-10-27 DIAGNOSIS — Z3042 Encounter for surveillance of injectable contraceptive: Secondary | ICD-10-CM

## 2020-10-27 MED ORDER — MEDROXYPROGESTERONE ACETATE 150 MG/ML IM SUSP
150.0000 mg | Freq: Once | INTRAMUSCULAR | Status: AC
  Administered 2020-10-27: 150 mg via INTRAMUSCULAR

## 2020-10-27 NOTE — Progress Notes (Signed)
Patient presents for Depo injection. Patient is within her window. Inj given in luoq. Patient tolerated well. Next depo due 7/26-8/9.

## 2021-01-12 ENCOUNTER — Other Ambulatory Visit: Payer: Self-pay

## 2021-01-12 ENCOUNTER — Ambulatory Visit (INDEPENDENT_AMBULATORY_CARE_PROVIDER_SITE_OTHER): Payer: Medicaid Other

## 2021-01-12 VITALS — BP 124/81 | HR 140 | Ht <= 58 in | Wt 167.0 lb

## 2021-01-12 DIAGNOSIS — Z3042 Encounter for surveillance of injectable contraceptive: Secondary | ICD-10-CM | POA: Diagnosis not present

## 2021-01-12 MED ORDER — MEDROXYPROGESTERONE ACETATE 150 MG/ML IM SUSP
150.0000 mg | Freq: Once | INTRAMUSCULAR | Status: AC
  Administered 2021-01-12: 150 mg via INTRAMUSCULAR

## 2021-01-12 NOTE — Progress Notes (Signed)
SUBJECTIVE: Judith Mcintosh is a 35 y.o. female who presents for DEPO Injection.  OBJECTIVE: Appears well, in no apparent distress.  .    ASSESSMENT: Within window for DEPO.  Last PAP 05/08/2020  PLAN:  DEPO injection given in RUOQ, tolerated well. Next DEPO due Oct. 11-25, 2022  Administrations This Visit     medroxyPROGESTERone (DEPO-PROVERA) injection 150 mg     Admin Date 01/12/2021 Action Given Dose 150 mg Route Intramuscular Administered By Maretta Bees, RMA

## 2021-02-16 ENCOUNTER — Other Ambulatory Visit: Payer: Self-pay | Admitting: Obstetrics

## 2021-02-16 DIAGNOSIS — I10 Essential (primary) hypertension: Secondary | ICD-10-CM

## 2021-02-19 ENCOUNTER — Encounter: Payer: Self-pay | Admitting: *Deleted

## 2021-04-06 ENCOUNTER — Ambulatory Visit: Payer: Medicaid Other

## 2021-04-06 ENCOUNTER — Other Ambulatory Visit: Payer: Self-pay

## 2021-04-06 ENCOUNTER — Ambulatory Visit (INDEPENDENT_AMBULATORY_CARE_PROVIDER_SITE_OTHER): Payer: Medicaid Other

## 2021-04-06 DIAGNOSIS — Z3042 Encounter for surveillance of injectable contraceptive: Secondary | ICD-10-CM | POA: Diagnosis not present

## 2021-04-06 MED ORDER — MEDROXYPROGESTERONE ACETATE 150 MG/ML IM SUSP
150.0000 mg | INTRAMUSCULAR | Status: AC
  Administered 2021-04-06 – 2024-07-09 (×6): 150 mg via INTRAMUSCULAR

## 2021-04-06 NOTE — Progress Notes (Signed)
The patient is in the office for depo injection, administered in [place] and pt tolerated well. Next due Jan 3-17 .Marland Kitchen Administrations This Visit     medroxyPROGESTERone (DEPO-PROVERA) injection 150 mg     Admin Date 04/06/2021 Action Given Dose 150 mg Route Intramuscular Administered By Katrina Stack, RN

## 2021-06-29 ENCOUNTER — Other Ambulatory Visit: Payer: Self-pay

## 2021-06-29 ENCOUNTER — Ambulatory Visit: Payer: Medicaid Other | Admitting: *Deleted

## 2021-06-29 DIAGNOSIS — Z3042 Encounter for surveillance of injectable contraceptive: Secondary | ICD-10-CM | POA: Diagnosis not present

## 2021-06-29 MED ORDER — MEDROXYPROGESTERONE ACETATE 150 MG/ML IM SUSP
150.0000 mg | Freq: Once | INTRAMUSCULAR | Status: AC
  Administered 2021-06-29: 150 mg via INTRAMUSCULAR

## 2021-06-29 NOTE — Progress Notes (Signed)
Date last pap: 05/08/20. Last Depo-Provera: 04/06/21. Side Effects if any: NA. Serum HCG indicated? NA. Depo-Provera 150 mg IM given by: Selena Batten. Rolley Sims, RNC in [employer]. Next appointment due 09/14/21-09/28/21.

## 2021-09-15 ENCOUNTER — Other Ambulatory Visit: Payer: Self-pay | Admitting: Obstetrics

## 2021-09-15 ENCOUNTER — Other Ambulatory Visit: Payer: Self-pay | Admitting: Emergency Medicine

## 2021-09-15 DIAGNOSIS — Z3009 Encounter for other general counseling and advice on contraception: Secondary | ICD-10-CM

## 2021-09-15 MED ORDER — MEDROXYPROGESTERONE ACETATE 150 MG/ML IM SUSP: INTRAMUSCULAR | 3 refills | Status: DC

## 2021-09-16 ENCOUNTER — Ambulatory Visit (INDEPENDENT_AMBULATORY_CARE_PROVIDER_SITE_OTHER): Payer: Medicaid Other

## 2021-09-16 DIAGNOSIS — Z3042 Encounter for surveillance of injectable contraceptive: Secondary | ICD-10-CM | POA: Diagnosis not present

## 2021-09-16 NOTE — Progress Notes (Signed)
Pt is in the office for depo, administered depo in [place] and pt tolerated well. Next due June 15-29. ?.. ?Administrations This Visit   ? ? medroxyPROGESTERone (DEPO-PROVERA) injection 150 mg   ? ? Admin Date ?09/16/2021 Action ?Given Dose ?150 mg Route ?Intramuscular Administered By ?Katrina Stack, RN  ? ?  ?  ? ?  ?  ?

## 2021-09-16 NOTE — Progress Notes (Signed)
Patient was assessed and managed by nursing staff during this encounter. I have reviewed the chart and agree with the documentation and plan. I have also made any necessary editorial changes. ? ?Warden Fillers, MD ?09/16/2021 11:45 AM   ?

## 2021-12-02 ENCOUNTER — Other Ambulatory Visit (HOSPITAL_COMMUNITY)
Admission: RE | Admit: 2021-12-02 | Discharge: 2021-12-02 | Disposition: A | Discharge status: Home / Self Care | Payer: Medicaid Other | Source: Ambulatory Visit | Attending: Obstetrics | Admitting: Obstetrics

## 2021-12-02 ENCOUNTER — Encounter: Payer: Self-pay | Admitting: Obstetrics

## 2021-12-02 ENCOUNTER — Ambulatory Visit (INDEPENDENT_AMBULATORY_CARE_PROVIDER_SITE_OTHER): Payer: Medicaid Other | Admitting: Obstetrics

## 2021-12-02 VITALS — BP 136/80 | HR 140 | Ht <= 58 in | Wt 177.2 lb

## 2021-12-02 DIAGNOSIS — Z01419 Encounter for gynecological examination (general) (routine) without abnormal findings: Secondary | ICD-10-CM

## 2021-12-02 DIAGNOSIS — I10 Essential (primary) hypertension: Secondary | ICD-10-CM | POA: Diagnosis not present

## 2021-12-02 DIAGNOSIS — N946 Dysmenorrhea, unspecified: Secondary | ICD-10-CM | POA: Diagnosis not present

## 2021-12-02 DIAGNOSIS — E669 Obesity, unspecified: Secondary | ICD-10-CM

## 2021-12-02 DIAGNOSIS — Z3042 Encounter for surveillance of injectable contraceptive: Secondary | ICD-10-CM

## 2021-12-02 MED ORDER — MEDROXYPROGESTERONE ACETATE 150 MG/ML IM SUSP
150.0000 mg | Freq: Once | INTRAMUSCULAR | Status: AC
  Administered 2021-12-02: 150 mg via INTRAMUSCULAR

## 2021-12-02 NOTE — Progress Notes (Signed)
Subjective:        Judith Mcintosh is a 36 y.o. female here for a routine exam.  Current complaints: None.    Personal health questionnaire:  Is patient Judith Mcintosh, have a family history of breast and/or ovarian cancer: no Is there a family history of uterine cancer diagnosed at age < 55, gastrointestinal cancer, urinary tract cancer, family member who is a Personnel officer syndrome-associated carrier: no Is the patient overweight and hypertensive, family history of diabetes, personal history of gestational diabetes, preeclampsia or PCOS: yes Is patient over 32, have PCOS,  family history of premature CHD under age 18, diabetes, smoke, have hypertension or peripheral artery disease:  no At any time, has a partner hit, kicked or otherwise hurt or frightened you?: no Over the past 2 weeks, have you felt down, depressed or hopeless?: no Over the past 2 weeks, have you felt little interest or pleasure in doing things?:no   Gynecologic History No LMP recorded. Patient has had an injection. Contraception: Depo-Provera injections Last Pap: 2021. Results were: normal Last mammogram: n/a. Results were: n/a  Obstetric History OB History  Gravida Para Term Preterm AB Living  4 4 3 1   4   SAB IAB Ectopic Multiple Live Births  0 0 0 0 4    # Outcome Date GA Lbr Len/2nd Weight Sex Delivery Anes PTL Lv  4 Term 10/28/14 [redacted]w[redacted]d 03:31 / 00:39 7 lb 12.5 oz (3.53 kg) F Vag-Spont EPI  LIV     Birth Comments: WNL  3 Preterm 04/21/11 [redacted]w[redacted]d 11:13 / 00:35 6 lb 5.8 oz (2.886 kg) F Vag-Spont EPI  LIV  2 Term      Vag-Spont   LIV  1 Term      Vag-Spont   LIV    Past Medical History:  Diagnosis Date   Abnormal Pap smear 2005   LEEP   Headache(784.0) Migraine   Irritable bowel syndrome (IBS)     Past Surgical History:  Procedure Laterality Date   LEEP  2005   NO PAST SURGERIES       Current Outpatient Medications:    amLODipine (NORVASC) 5 MG tablet, TAKE 1 TABLET BY MOUTH EVERY DAY, Disp: 90  tablet, Rfl: 3   ibuprofen (ADVIL) 800 MG tablet, Take 1 tablet (800 mg total) by mouth every 8 (eight) hours as needed., Disp: 30 tablet, Rfl: 5   medroxyPROGESTERone (DEPO-PROVERA) 150 MG/ML injection, INJECT 2006 INTO THE MUSCLE EVERY 3 MONTHS, Disp: 1 mL, Rfl: 3   triamterene-hydrochlorothiazide (DYAZIDE) 37.5-25 MG capsule, TAKE 1 EACH (1 CAPSULE TOTAL) BY MOUTH DAILY., Disp: 90 capsule, Rfl: 3  Current Facility-Administered Medications:    medroxyPROGESTERone (DEPO-PROVERA) injection 150 mg, 150 mg, Intramuscular, Q90 days, 03-26-1992, MD, 150 mg at 09/16/21 1010   medroxyPROGESTERone (DEPO-PROVERA) injection 150 mg, 150 mg, Intramuscular, Once, 09/18/21, MD No Known Allergies  Social History   Tobacco Use   Smoking status: Never   Smokeless tobacco: Never  Substance Use Topics   Alcohol use: No    Alcohol/week: 0.0 standard drinks of alcohol    Family History  Problem Relation Age of Onset   Hypertension Mother    Hypertension Father    Hypertension Maternal Grandmother       Review of Systems  Constitutional: negative for fatigue and weight loss Respiratory: negative for cough and wheezing Cardiovascular: negative for chest pain, fatigue and palpitations Gastrointestinal: negative for abdominal pain and change in bowel habits Musculoskeletal:negative for myalgias  Neurological: negative for gait problems and tremors Behavioral/Psych: negative for abusive relationship, depression Endocrine: negative for temperature intolerance    Genitourinary:negative for abnormal menstrual periods, genital lesions, hot flashes, sexual problems and vaginal discharge Integument/breast: negative for breast lump, breast tenderness, nipple discharge and skin lesion(s)    Objective:       BP 136/80   Pulse (!) 140   Ht 4\' 8"  (1.422 m)   Wt 177 lb 3.2 oz (80.4 kg)   BMI 39.73 kg/m  General:   alert  Skin:   no rash or abnormalities  Lungs:   clear to auscultation  bilaterally  Heart:   regular rate and rhythm, S1, S2 normal, no murmur, click, rub or gallop  Breasts:   normal without suspicious masses, skin or nipple changes or axillary nodes  Abdomen:  normal findings: no organomegaly, soft, non-tender and no hernia  Pelvis:  External genitalia: normal general appearance Urinary system: urethral meatus normal and bladder without fullness, nontender Vaginal: normal without tenderness, induration or masses Cervix: normal appearance Adnexa: normal bimanual exam Uterus: anteverted and non-tender, normal size   Lab Review Urine pregnancy test Labs reviewed yes Radiologic studies reviewed no  I have spent a total of 20 minutes of face-to-face time, excluding clinical staff time, reviewing notes and preparing to see patient, ordering tests and/or medications, and counseling the patient.   Assessment:    1. Encounter for gynecological examination with Papanicolaou smear of cervix - pap smear done  2. Encounter for surveillance of injectable contraceptive - pleased with Depo  3. Encounter for Depo-Provera contraception Rx: - medroxyPROGESTERone (DEPO-PROVERA) injection 150 mg  4. Dysmenorrhea - Ibuprofen  5. HTN (hypertension), benign - clinically stable  6. Obesity (BMI 35.0-39.9 without comorbidity)     Plan:    Education reviewed: calcium supplements, depression evaluation, low fat, low cholesterol diet, safe sex/STD prevention, self breast exams, and weight bearing exercise. Contraception: Depo-Provera injections. Follow up in: 1 year.   Meds ordered this encounter  Medications   medroxyPROGESTERone (DEPO-PROVERA) injection 150 mg     [birth year]1990, MD 12/02/2021 10:28 AM

## 2021-12-02 NOTE — Progress Notes (Signed)
Patient presents for AEX and depo injection. Patient declines STD testing, yeast or bv. Patient has no concerns today.  Last pap: 05/08/2020 Normal Last Depo Inj: 09/16/21  Patient is within her depo window. Inj given in [facility]. Patient tolerated well. Next depo due between 8/30-9/14.

## 2021-12-06 LAB — CYTOLOGY - PAP
Comment: NEGATIVE
Diagnosis: NEGATIVE
High risk HPV: NEGATIVE

## 2022-02-22 ENCOUNTER — Ambulatory Visit: Payer: Medicaid Other

## 2022-02-23 ENCOUNTER — Ambulatory Visit (INDEPENDENT_AMBULATORY_CARE_PROVIDER_SITE_OTHER): Payer: Medicaid Other | Admitting: General Practice

## 2022-02-23 DIAGNOSIS — Z3042 Encounter for surveillance of injectable contraceptive: Secondary | ICD-10-CM

## 2022-02-23 MED ORDER — MEDROXYPROGESTERONE ACETATE 150 MG/ML IM SUSP
150.0000 mg | Freq: Once | INTRAMUSCULAR | Status: AC
  Administered 2022-02-23: 150 mg via INTRAMUSCULAR

## 2022-02-23 NOTE — Progress Notes (Signed)
Date last pap: 12-02-21. Last Depo-Provera: 12-02-21. Side Effects if any: Pt tolerated well. Depo-Provera 150 mg IM given by: Hope Pigeon, CMA. Next appointment due between 11/22-12/6.

## 2022-03-11 ENCOUNTER — Other Ambulatory Visit: Payer: Self-pay | Admitting: Obstetrics

## 2022-03-11 DIAGNOSIS — I10 Essential (primary) hypertension: Secondary | ICD-10-CM

## 2022-05-18 ENCOUNTER — Ambulatory Visit (INDEPENDENT_AMBULATORY_CARE_PROVIDER_SITE_OTHER): Payer: BLUE CROSS/BLUE SHIELD | Admitting: *Deleted

## 2022-05-18 VITALS — BP 142/84 | HR 147

## 2022-05-18 DIAGNOSIS — Z3042 Encounter for surveillance of injectable contraceptive: Secondary | ICD-10-CM | POA: Diagnosis not present

## 2022-05-18 MED ORDER — MEDROXYPROGESTERONE ACETATE 150 MG/ML IM SUSP
150.0000 mg | Freq: Once | INTRAMUSCULAR | Status: AC
  Administered 2022-05-18: 150 mg via INTRAMUSCULAR

## 2022-05-18 NOTE — Progress Notes (Signed)
Date last pap:12/02/21 . Last Depo-Provera: 02/23/22. Side Effects if any: NA. Serum HCG indicated? NA. Depo-Provera 150 mg IM given by: Selena Batten. Rolley Sims, RNC in [employer]. Next appointment due 08/03/22-08/17/22.  Pt BP and HR elevated. Denies CP, SOB. Skin warm and dry. No acute distress noted. Strongly recommended f/u with PCP for BP and HR management as meds may need to be adjusted and cardiology referral may be needed. Pt verbalized understanding.

## 2022-06-15 ENCOUNTER — Encounter: Payer: Self-pay | Admitting: Obstetrics

## 2022-06-16 ENCOUNTER — Other Ambulatory Visit: Payer: Self-pay | Admitting: Emergency Medicine

## 2022-06-16 DIAGNOSIS — N898 Other specified noninflammatory disorders of vagina: Secondary | ICD-10-CM

## 2022-06-16 MED ORDER — METRONIDAZOLE 0.75 % VA GEL: 1.0000 | Freq: Every day | VAGINAL | 1 refills | Status: DC

## 2022-06-16 NOTE — Progress Notes (Signed)
Rx for vaginal discharge

## 2022-08-10 ENCOUNTER — Encounter: Payer: Self-pay | Admitting: Obstetrics

## 2022-08-10 ENCOUNTER — Other Ambulatory Visit: Payer: Self-pay | Admitting: Obstetrics and Gynecology

## 2022-08-10 DIAGNOSIS — Z3009 Encounter for other general counseling and advice on contraception: Secondary | ICD-10-CM

## 2022-08-11 ENCOUNTER — Ambulatory Visit: Payer: BLUE CROSS/BLUE SHIELD

## 2022-08-11 ENCOUNTER — Ambulatory Visit (INDEPENDENT_AMBULATORY_CARE_PROVIDER_SITE_OTHER): Payer: BLUE CROSS/BLUE SHIELD

## 2022-08-11 ENCOUNTER — Other Ambulatory Visit: Payer: Self-pay

## 2022-08-11 VITALS — BP 130/80 | HR 147

## 2022-08-11 DIAGNOSIS — Z3042 Encounter for surveillance of injectable contraceptive: Secondary | ICD-10-CM | POA: Diagnosis not present

## 2022-08-11 DIAGNOSIS — Z3009 Encounter for other general counseling and advice on contraception: Secondary | ICD-10-CM

## 2022-08-11 MED ORDER — MEDROXYPROGESTERONE ACETATE 150 MG/ML IM SUSP: INTRAMUSCULAR | 0 refills | Status: DC

## 2022-08-11 NOTE — Progress Notes (Signed)
Pt is in the office for depo injection. Administered in [facility] and pt tolerated well. Next due May 10-24. Advised pt of pulse today of 147, advised to check at home and follow up with PCP .Marland Kitchen Administrations This Visit     medroxyPROGESTERone (DEPO-PROVERA) injection 150 mg     Admin Date 08/11/2022 Action Given Dose 150 mg Route Intramuscular Administered By Hinton Lovely, RN

## 2022-09-01 ENCOUNTER — Encounter: Payer: Self-pay | Admitting: Obstetrics

## 2022-09-03 ENCOUNTER — Other Ambulatory Visit: Payer: Self-pay | Admitting: Obstetrics

## 2022-09-03 DIAGNOSIS — I10 Essential (primary) hypertension: Secondary | ICD-10-CM

## 2022-09-03 MED ORDER — AMLODIPINE BESYLATE 5 MG PO TABS: 5.0000 mg | ORAL_TABLET | Freq: Every day | ORAL | 1 refills | Status: DC

## 2022-09-03 MED ORDER — TRIAMTERENE-HCTZ 37.5-25 MG PO CAPS: 1.0000 | ORAL_CAPSULE | Freq: Every morning | ORAL | 1 refills | Status: AC

## 2022-09-27 ENCOUNTER — Other Ambulatory Visit: Payer: Self-pay | Admitting: Obstetrics

## 2022-09-27 DIAGNOSIS — I10 Essential (primary) hypertension: Secondary | ICD-10-CM

## 2022-10-17 ENCOUNTER — Other Ambulatory Visit: Payer: Self-pay | Admitting: Obstetrics

## 2022-10-17 DIAGNOSIS — Z3009 Encounter for other general counseling and advice on contraception: Secondary | ICD-10-CM

## 2022-10-17 MED ORDER — MEDROXYPROGESTERONE ACETATE 150 MG/ML IM SUSP: INTRAMUSCULAR | 0 refills | Status: DC

## 2022-11-01 ENCOUNTER — Other Ambulatory Visit: Payer: Self-pay | Admitting: Obstetrics

## 2022-11-01 DIAGNOSIS — Z3009 Encounter for other general counseling and advice on contraception: Secondary | ICD-10-CM

## 2022-11-02 ENCOUNTER — Ambulatory Visit (INDEPENDENT_AMBULATORY_CARE_PROVIDER_SITE_OTHER): Payer: BLUE CROSS/BLUE SHIELD

## 2022-11-02 DIAGNOSIS — Z1339 Encounter for screening examination for other mental health and behavioral disorders: Secondary | ICD-10-CM

## 2022-11-02 DIAGNOSIS — Z3042 Encounter for surveillance of injectable contraceptive: Secondary | ICD-10-CM

## 2022-11-02 MED ORDER — MEDROXYPROGESTERONE ACETATE 150 MG/ML IM SUSY
150.0000 mg | PREFILLED_SYRINGE | Freq: Once | INTRAMUSCULAR | Status: AC
  Administered 2022-11-02: 150 mg via INTRAMUSCULAR

## 2022-11-02 NOTE — Progress Notes (Signed)
Date last pap: 12/02/21. Last Depo-Provera: 08/11/22. Side Effects if any: N/A. Serum HCG indicated? N/A. Depo-Provera 150 mg IM given by: Dreama Saa., RN. Inj given in [facility]. Patient tolerated well.Marland Kitchen Next appointment due July 31-Aug 14.

## 2022-11-08 ENCOUNTER — Other Ambulatory Visit: Payer: Self-pay | Admitting: Obstetrics

## 2022-11-08 DIAGNOSIS — I10 Essential (primary) hypertension: Secondary | ICD-10-CM

## 2022-11-15 ENCOUNTER — Other Ambulatory Visit: Payer: Self-pay | Admitting: Obstetrics

## 2022-11-15 DIAGNOSIS — I10 Essential (primary) hypertension: Secondary | ICD-10-CM

## 2023-01-26 ENCOUNTER — Ambulatory Visit (INDEPENDENT_AMBULATORY_CARE_PROVIDER_SITE_OTHER): Payer: BLUE CROSS/BLUE SHIELD | Admitting: Emergency Medicine

## 2023-01-26 VITALS — BP 149/94 | HR 137 | Wt 173.9 lb

## 2023-01-26 DIAGNOSIS — Z3042 Encounter for surveillance of injectable contraceptive: Secondary | ICD-10-CM

## 2023-01-26 MED ORDER — MEDROXYPROGESTERONE ACETATE 150 MG/ML IM SUSP
150.0000 mg | Freq: Once | INTRAMUSCULAR | Status: AC
  Administered 2023-01-26: 150 mg via INTRAMUSCULAR

## 2023-01-26 NOTE — Progress Notes (Signed)
Date last pap: 12/02/2021. Last Depo-Provera: 11/02/2022. Side Effects if any: None. Serum HCG indicated? None. Depo-Provera 150 mg IM given by: Resa Miner, RN into [facility], tolerate well in office. Next appointment due Oct 24-Nov 7.

## 2023-04-18 ENCOUNTER — Other Ambulatory Visit: Payer: Self-pay

## 2023-04-18 DIAGNOSIS — Z3009 Encounter for other general counseling and advice on contraception: Secondary | ICD-10-CM

## 2023-04-18 MED ORDER — MEDROXYPROGESTERONE ACETATE 150 MG/ML IM SUSP: INTRAMUSCULAR | 0 refills | Status: AC

## 2023-04-20 ENCOUNTER — Ambulatory Visit: Payer: BLUE CROSS/BLUE SHIELD | Admitting: Emergency Medicine

## 2023-04-20 VITALS — BP 159/84 | HR 100 | Wt 181.9 lb

## 2023-04-20 DIAGNOSIS — Z3042 Encounter for surveillance of injectable contraceptive: Secondary | ICD-10-CM

## 2023-04-20 MED ORDER — MEDROXYPROGESTERONE ACETATE 150 MG/ML IM SUSP
150.0000 mg | Freq: Once | INTRAMUSCULAR | Status: AC
  Administered 2023-04-20: 150 mg via INTRAMUSCULAR

## 2023-04-20 NOTE — Progress Notes (Signed)
Date last pap: 12/02/2021. Last Depo-Provera: 01/26/23. Side Effects if any: NA. Serum HCG indicated? NA. Depo-Provera 150 mg IM given by: Resa Miner, RN into [facility], tolerated well. Next appointment due Jan 16-Jan 30.

## 2023-05-11 ENCOUNTER — Other Ambulatory Visit: Payer: Self-pay | Admitting: Obstetrics and Gynecology

## 2023-05-11 DIAGNOSIS — Z3009 Encounter for other general counseling and advice on contraception: Secondary | ICD-10-CM

## 2023-07-11 ENCOUNTER — Encounter: Payer: Self-pay | Admitting: Advanced Practice Midwife

## 2023-07-11 ENCOUNTER — Ambulatory Visit: Payer: Medicaid Other | Admitting: Advanced Practice Midwife

## 2023-07-11 VITALS — BP 147/93 | HR 150 | Ht <= 58 in | Wt 179.8 lb

## 2023-07-11 DIAGNOSIS — I10 Essential (primary) hypertension: Secondary | ICD-10-CM

## 2023-07-11 DIAGNOSIS — Z3042 Encounter for surveillance of injectable contraceptive: Secondary | ICD-10-CM

## 2023-07-11 MED ORDER — MEDROXYPROGESTERONE ACETATE 150 MG/ML IM SUSP: 150.0000 mg | INTRAMUSCULAR | 3 refills | Status: DC

## 2023-07-11 MED ORDER — MEDROXYPROGESTERONE ACETATE 150 MG/ML IM SUSP
150.0000 mg | Freq: Once | INTRAMUSCULAR | Status: AC
  Administered 2023-07-11: 150 mg via INTRAMUSCULAR

## 2023-07-11 NOTE — Progress Notes (Signed)
ANNUAL EXAM Patient name: Judith Mcintosh MRN [id number]  Date of birth: [birth year] Chief Complaint:   Gynecologic Exam  History of Present Illness:   Judith Mcintosh is a 38 y.o. [phone] African-American female being seen today for a routine annual exam.  Current complaints: None  No LMP recorded. Patient has had an injection.   The pregnancy intention screening data noted above was reviewed. Potential methods of contraception were discussed. The patient elected to proceed with depo Provera  Last pap 12/02/2021. Results were: NILM with NEG HR HPV H/O abnormal pap: no Last mammogram: 12/02/21. Results were: normal. Family h/o breast cancer: no Last colonoscopy: NA. Results were: N/A.      11/02/2022    8:40 AM 05/26/2015   10:22 AM  Depression screen PHQ 2/9  Decreased Interest 0 2  Down, Depressed, Hopeless 0 0  PHQ - 2 Score 0 2  Altered sleeping 0 3  Tired, decreased energy 0 0  Change in appetite 0 0  Feeling bad or failure about yourself  0 0  Trouble concentrating 0 0  Moving slowly or fidgety/restless 0 0  Suicidal thoughts 0 0  PHQ-9 Score 0 5  Difficult doing work/chores  Not difficult at all        11/02/2022    8:40 AM  GAD 7 : Generalized Anxiety Score  Nervous, Anxious, on Edge 0  Control/stop worrying 0  Worry too much - different things 0  Trouble relaxing 0  Restless 0  Easily annoyed or irritable 0  Afraid - awful might happen 0  Total GAD 7 Score 0     Review of Systems:   Pertinent items are noted in HPI Denies any headaches, blurred vision, fatigue, shortness of breath, chest pain, abdominal pain, abnormal vaginal discharge/itching/odor/irritation, problems with periods, bowel movements, urination, or intercourse unless otherwise stated above.  Pertinent History Reviewed:  Reviewed past medical,surgical, social and family history.  Reviewed problem list, medications and allergies.  Physical Assessment:   Vitals:   07/11/23 1034  07/11/23 1039  BP: (!) 151/91 (!) 147/93  Pulse: (!) 161 (!) 150  Weight: 179 lb 12.8 oz (81.6 kg)   Height: 4\' 8"  (1.422 m)    Body mass index is 40.31 kg/m.   Physical Examination:  General appearance - well appearing, and in no distress Mental status - alert, oriented to person, place, and time Psych:  She has a normal mood and affect Skin - warm and dry, normal color, no suspicious lesions noted Chest - effort normal, no problems with respiration noted Heart - normal rate and regular rhythm Neck:  midline trachea, no thyromegaly or nodules Breasts - Exam declined Abdomen - Exam declined Pelvic -  Exam declined  VAGINA: Exam declined   CERVIX: Exam declined Thin prep pap is not done  Extremities:  No swelling or varicosities noted  Patient declined PE Today   No results found for this or any previous visit (from the past 24 hours).  Assessment & Plan:      1. Encounter for Depo-Provera contraception (Primary)  - medroxyPROGESTERone (DEPO-PROVERA) injection 150 mg - medroxyPROGESTERone (DEPO-PROVERA) 150 MG/ML injection; Inject 1 mL (150 mg total) into the muscle every 3 (three) months.  Dispense: 1 mL; Refill: 3  2. HTN (hypertension), benign  Vitals:   07/11/23 1034 07/11/23 1039  BP: (!) 151/91 (!) 147/93     Current Outpatient Medications:    amLODipine (NORVASC) 5 MG tablet, Take 1 tablet (5 mg total)  by mouth daily., Disp: 30 tablet, Rfl: 1   medroxyPROGESTERone (DEPO-PROVERA) 150 MG/ML injection, INJECT INTO THE MUSCLE EVERY 3 MONTHS, Disp: 1 mL, Rfl: 0   medroxyPROGESTERone (DEPO-PROVERA) 150 MG/ML injection, Inject 1 mL (150 mg total) into the muscle every 3 (three) months., Disp: 1 mL, Rfl: 3   triamterene-hydrochlorothiazide (DYAZIDE) 37.5-25 MG capsule, Take 1 each (1 capsule total) by mouth every morning., Disp: 30 capsule, Rfl: 1   ibuprofen (ADVIL) 800 MG tablet, Take 1 tablet (800 mg total) by mouth every 8 (eight) hours as needed. (Patient not  taking: Reported on 07/11/2023), Disp: 30 tablet, Rfl: 5   metroNIDAZOLE (METROGEL) 0.75 % vaginal gel, Place 1 Applicatorful vaginally at bedtime. Apply one applicatorful to vagina at bedtime for 5 days (Patient not taking: Reported on 07/11/2023), Disp: 70 g, Rfl: 1  Current Facility-Administered Medications:    medroxyPROGESTERone (DEPO-PROVERA) injection 150 mg, 150 mg, Intramuscular, Q90 days, Adam Phenix, MD, 150 mg at 08/11/22 1330   - Will follow up results of pap smear and manage accordingly.  - Routine preventative health maintenance measures emphasized.  - Please refer to After Visit Summary for other counseling recommendations.       Mammogram: @ 38yo, or sooner if problems Colonoscopy: @ 38yo, or sooner if problems     Meds:  Meds ordered this encounter  Medications   medroxyPROGESTERone (DEPO-PROVERA) injection 150 mg   medroxyPROGESTERone (DEPO-PROVERA) 150 MG/ML injection    Sig: Inject 1 mL (150 mg total) into the muscle every 3 (three) months.    Dispense:  1 mL    Refill:  3    Supervising Provider:   Reva Bores [[id number]]    Follow-up: Return in about 3 months (around 10/09/2023) for DEPO.  Colman Cater, NP 07/11/2023 1:53 PM

## 2023-07-11 NOTE — Progress Notes (Signed)
Pt presents for AEX. Pt needs Depo refill. Last Depo 04/20/23. Declines STD testing.  Depo given today in [facility] per pt request.  Next Depo due 4/8-4/22

## 2023-07-13 ENCOUNTER — Other Ambulatory Visit: Payer: Self-pay | Admitting: Obstetrics

## 2023-07-13 DIAGNOSIS — I10 Essential (primary) hypertension: Secondary | ICD-10-CM

## 2023-08-05 ENCOUNTER — Other Ambulatory Visit: Payer: Self-pay | Admitting: Family Medicine

## 2023-08-05 DIAGNOSIS — I10 Essential (primary) hypertension: Secondary | ICD-10-CM

## 2023-10-04 ENCOUNTER — Ambulatory Visit: Payer: BLUE CROSS/BLUE SHIELD

## 2023-10-09 ENCOUNTER — Other Ambulatory Visit: Payer: Self-pay

## 2023-10-09 DIAGNOSIS — Z3042 Encounter for surveillance of injectable contraceptive: Secondary | ICD-10-CM

## 2023-10-09 MED ORDER — MEDROXYPROGESTERONE ACETATE 150 MG/ML IM SUSP: 150.0000 mg | INTRAMUSCULAR | 0 refills | Status: DC

## 2023-10-10 ENCOUNTER — Ambulatory Visit

## 2023-10-10 VITALS — BP 142/89 | HR 153 | Wt 172.9 lb

## 2023-10-10 DIAGNOSIS — Z3042 Encounter for surveillance of injectable contraceptive: Secondary | ICD-10-CM | POA: Diagnosis not present

## 2023-10-10 NOTE — Progress Notes (Signed)
 Pt is in the office for depo injection. Administered in [facility] and pt tolerated well. Next due July 8-22. Pt states that her BP and pulse are only elevated when she comes into the office, pt's mother is with her and agrees with statement. They both state that the pt has a home BP cuff and BP and pulse are within normal range outside of this office, last time checked was before this visit. .. Administrations This Visit     medroxyPROGESTERone  (DEPO-PROVERA ) injection 150 mg     Admin Date 10/10/2023 Action Given Dose 150 mg Route Intramuscular Documented By Sarrah Cure, RN

## 2023-10-17 ENCOUNTER — Encounter: Payer: Self-pay | Admitting: Obstetrics

## 2023-10-18 ENCOUNTER — Other Ambulatory Visit: Payer: Self-pay

## 2023-10-18 DIAGNOSIS — N898 Other specified noninflammatory disorders of vagina: Secondary | ICD-10-CM

## 2023-10-18 MED ORDER — METRONIDAZOLE 0.75 % VA GEL: 1.0000 | Freq: Every day | VAGINAL | 0 refills | Status: AC

## 2023-10-18 MED ORDER — METRONIDAZOLE 0.75 % VA GEL: 1.0000 | Freq: Every day | VAGINAL | 1 refills | Status: DC

## 2024-01-09 ENCOUNTER — Ambulatory Visit (INDEPENDENT_AMBULATORY_CARE_PROVIDER_SITE_OTHER)

## 2024-01-09 DIAGNOSIS — Z3042 Encounter for surveillance of injectable contraceptive: Secondary | ICD-10-CM | POA: Diagnosis not present

## 2024-01-09 MED ORDER — MEDROXYPROGESTERONE ACETATE 150 MG/ML IM SUSP
150.0000 mg | Freq: Once | INTRAMUSCULAR | Status: AC
  Administered 2024-01-09: 150 mg via INTRAMUSCULAR

## 2024-01-09 NOTE — Progress Notes (Signed)
 Date last pap: 12/02/21. Last Depo-Provera : 10/10/23. Side Effects if any: NA. Serum HCG indicated? NA. Depo-Provera  150 mg IM given by: Duwaine Galla, RN in [facility]. Next appointment due Oct 7-21 2025.  Pt BP elevated, but according to pt and her mom it is always normal at home and only elevated when she comes to doctor's office. Per note on 10/10/23, same situation.

## 2024-04-09 ENCOUNTER — Ambulatory Visit

## 2024-04-09 VITALS — BP 147/93 | HR 142

## 2024-04-09 DIAGNOSIS — Z3042 Encounter for surveillance of injectable contraceptive: Secondary | ICD-10-CM | POA: Diagnosis not present

## 2024-04-09 NOTE — Progress Notes (Addendum)
 Pt is in the office for depo injection. Administered in [facility] and pt tolerated well. Next due Jan 6-20. Pt's BP and pulse is elevated today, previous encounters and notes show that it has been elevated in this office. Pt and pt's mother state it is only elevated when coming here and pt states that she gets extremely nervous at the Dr's office. Pt states that she is taking her BP meds. .. Administrations This Visit     medroxyPROGESTERone  (DEPO-PROVERA ) injection 150 mg     Admin Date 04/09/2024 Action Given Dose 150 mg Route Intramuscular Documented By Doneta Laymon BIRCH, RN

## 2024-07-01 ENCOUNTER — Encounter: Payer: Self-pay | Admitting: Advanced Practice Midwife

## 2024-07-01 ENCOUNTER — Encounter: Payer: Self-pay | Admitting: Obstetrics

## 2024-07-02 ENCOUNTER — Telehealth: Payer: Self-pay

## 2024-07-02 DIAGNOSIS — Z3042 Encounter for surveillance of injectable contraceptive: Secondary | ICD-10-CM

## 2024-07-02 MED ORDER — MEDROXYPROGESTERONE ACETATE 150 MG/ML IM SUSP: 150.0000 mg | INTRAMUSCULAR | 0 refills | Status: AC

## 2024-07-02 NOTE — Telephone Encounter (Signed)
-----   Message from [place] J sent at 07/02/2024 10:55 AM EST ----- Regarding: Patient scheduled for Annual Patient has been scheduled for first available Annual on 2/4 - wanted to make you aware - hoping to prevent any stops on her Depo prescription.

## 2024-07-09 ENCOUNTER — Encounter: Payer: Self-pay | Admitting: Advanced Practice Midwife

## 2024-07-09 ENCOUNTER — Ambulatory Visit

## 2024-07-09 DIAGNOSIS — Z3042 Encounter for surveillance of injectable contraceptive: Secondary | ICD-10-CM | POA: Diagnosis not present

## 2024-07-09 MED ORDER — MEDROXYPROGESTERONE ACETATE 150 MG/ML IM SUSP: 150.0000 mg | INTRAMUSCULAR | 0 refills | Status: AC

## 2024-07-09 NOTE — Progress Notes (Signed)
..  Date last pap: 12/02/2021. Last Depo-Provera : 04/09/24. Side Effects if any: N/a. Serum HCG indicated? N/a. Depo-Provera  150 mg IM given in [facility] Next appointment due April 7-21. BP and pulse elevated in office, pt reports that she is always anxious at doctor appts and it is always like this. Currently taking Amlodipine . Pt desires to change annual visit to be within next depo window.  .. Administrations This Visit     medroxyPROGESTERone  (DEPO-PROVERA ) injection 150 mg     Admin Date 07/09/2024 Action Given Dose 150 mg Route Intramuscular Documented By Doneta Laymon BIRCH, RN

## 2024-07-24 ENCOUNTER — Ambulatory Visit: Payer: Self-pay | Admitting: Obstetrics

## 2024-09-26 ENCOUNTER — Ambulatory Visit: Payer: Self-pay | Admitting: Obstetrics
